# Patient Record
Sex: Male | Born: 1960 | Race: White | Hispanic: No | Marital: Single | Smoking: Current every day smoker
Health system: Southern US, Community
[De-identification: ages and names within clinical notes are randomized; demographics above are authoritative.]

## PROBLEM LIST (undated history)

## (undated) DIAGNOSIS — I10 Essential (primary) hypertension: Secondary | ICD-10-CM

## (undated) DIAGNOSIS — F1010 Alcohol abuse, uncomplicated: Secondary | ICD-10-CM

---

## 2004-03-17 ENCOUNTER — Emergency Department (HOSPITAL_COMMUNITY): Admission: EM | Admit: 2004-03-17 | Discharge: 2004-03-17 | Payer: Self-pay | Admitting: Emergency Medicine

## 2005-01-04 ENCOUNTER — Ambulatory Visit: Payer: Self-pay | Admitting: Family Medicine

## 2005-07-13 ENCOUNTER — Ambulatory Visit: Payer: Self-pay | Admitting: Family Medicine

## 2005-07-28 ENCOUNTER — Ambulatory Visit: Payer: Self-pay | Admitting: Family Medicine

## 2007-03-15 ENCOUNTER — Emergency Department (HOSPITAL_COMMUNITY): Admission: EM | Admit: 2007-03-15 | Discharge: 2007-03-15 | Payer: Self-pay | Admitting: Emergency Medicine

## 2011-06-14 LAB — URINALYSIS, ROUTINE W REFLEX MICROSCOPIC
Hgb urine dipstick: NEGATIVE
Ketones, ur: NEGATIVE
Nitrite: NEGATIVE
Protein, ur: NEGATIVE
Specific Gravity, Urine: 1.017

## 2011-06-14 LAB — DIFFERENTIAL
Basophils Relative: 1
Eosinophils Absolute: 0.1
Lymphocytes Relative: 34
Monocytes Relative: 7
Neutro Abs: 3.3
Neutrophils Relative %: 58

## 2011-06-14 LAB — RAPID URINE DRUG SCREEN, HOSP PERFORMED
Amphetamines: NOT DETECTED
Cocaine: NOT DETECTED
Opiates: NOT DETECTED

## 2011-06-14 LAB — CBC
HCT: 49.9
Hemoglobin: 17.7 — ABNORMAL HIGH
MCHC: 35.4
Platelets: 280
RBC: 5.98 — ABNORMAL HIGH
RDW: 14.3 — ABNORMAL HIGH
WBC: 5.7

## 2011-06-14 LAB — BASIC METABOLIC PANEL
BUN: 7
Creatinine, Ser: 0.65

## 2014-01-08 ENCOUNTER — Encounter (HOSPITAL_COMMUNITY): Payer: Self-pay | Admitting: Emergency Medicine

## 2014-01-08 ENCOUNTER — Emergency Department (HOSPITAL_COMMUNITY)
Admission: EM | Admit: 2014-01-08 | Discharge: 2014-01-09 | Disposition: A | Discharge status: Home / Self Care | Payer: Self-pay | Attending: Emergency Medicine | Admitting: Emergency Medicine

## 2014-01-08 DIAGNOSIS — F17200 Nicotine dependence, unspecified, uncomplicated: Secondary | ICD-10-CM | POA: Insufficient documentation

## 2014-01-08 DIAGNOSIS — F1010 Alcohol abuse, uncomplicated: Secondary | ICD-10-CM | POA: Insufficient documentation

## 2014-01-08 DIAGNOSIS — I10 Essential (primary) hypertension: Secondary | ICD-10-CM | POA: Insufficient documentation

## 2014-01-08 HISTORY — DX: Essential (primary) hypertension: I10

## 2014-01-08 LAB — COMPREHENSIVE METABOLIC PANEL
ALBUMIN: 4.1 g/dL (ref 3.5–5.2)
ALT: 375 U/L — ABNORMAL HIGH (ref 0–53)
AST: 456 U/L — ABNORMAL HIGH (ref 0–37)
Alkaline Phosphatase: 192 U/L — ABNORMAL HIGH (ref 39–117)
BILIRUBIN TOTAL: 2.1 mg/dL — AB (ref 0.3–1.2)
BUN: 8 mg/dL (ref 6–23)
CALCIUM: 9.8 mg/dL (ref 8.4–10.5)
CHLORIDE: 93 meq/L — AB (ref 96–112)
CO2: 26 meq/L (ref 19–32)
CREATININE: 0.57 mg/dL (ref 0.50–1.35)
GFR calc Af Amer: >90 mL/min (ref >90)
GFR calc non Af Amer: >90 mL/min (ref >90)
Glucose, Bld: 99 mg/dL (ref 70–99)
POTASSIUM: 3.5 meq/L — AB (ref 3.7–5.3)
Sodium: 134 mEq/L — ABNORMAL LOW (ref 137–147)
TOTAL PROTEIN: 7.4 g/dL (ref 6.0–8.3)

## 2014-01-08 LAB — CBC
HEMATOCRIT: 45.3 % (ref 39.0–52.0)
HEMOGLOBIN: 16.2 g/dL (ref 13.0–17.0)
MCH: 32.1 pg (ref 26.0–34.0)
MCHC: 35.8 g/dL (ref 30.0–36.0)
MCV: 89.9 fL (ref 78.0–100.0)
Platelets: 112 10*3/uL — ABNORMAL LOW (ref 150–400)
RBC: 5.04 MIL/uL (ref 4.22–5.81)
RDW: 13 % (ref 11.5–15.5)
WBC: 5.1 10*3/uL (ref 4.0–10.5)

## 2014-01-08 LAB — RAPID URINE DRUG SCREEN, HOSP PERFORMED
Amphetamines: NOT DETECTED
BARBITURATES: NOT DETECTED
Benzodiazepines: NOT DETECTED
Cocaine: NOT DETECTED
Opiates: NOT DETECTED
Tetrahydrocannabinol: NOT DETECTED

## 2014-01-08 LAB — ACETAMINOPHEN LEVEL: Acetaminophen (Tylenol), Serum: <15 ug/mL (ref 10–30)

## 2014-01-08 LAB — SALICYLATE LEVEL: Salicylate Lvl: <2 mg/dL — ABNORMAL LOW (ref 2.8–20.0)

## 2014-01-08 LAB — ETHANOL: Alcohol, Ethyl (B): <11 mg/dL (ref 0–11)

## 2014-01-08 MED ORDER — LORAZEPAM 1 MG PO TABS: 0.0000 mg | ORAL_TABLET | Freq: Two times a day (BID) | ORAL | Status: DC

## 2014-01-08 MED ORDER — LORAZEPAM 1 MG PO TABS
0.0000 mg | ORAL_TABLET | Freq: Four times a day (QID) | ORAL | Status: DC
  Administered 2014-01-08: 1 mg via ORAL
  Filled 2014-01-08: qty 1

## 2014-01-08 MED ORDER — NICOTINE 21 MG/24HR TD PT24
21.0000 mg | MEDICATED_PATCH | Freq: Every day | TRANSDERMAL | Status: DC
  Administered 2014-01-08: 21 mg via TRANSDERMAL
  Filled 2014-01-08: qty 1

## 2014-01-08 MED ORDER — VITAMIN B-1 100 MG PO TABS
100.0000 mg | ORAL_TABLET | Freq: Every day | ORAL | Status: DC
  Administered 2014-01-08: 100 mg via ORAL
  Filled 2014-01-08: qty 1

## 2014-01-08 MED ORDER — THIAMINE HCL 100 MG/ML IJ SOLN: 100.0000 mg | Freq: Every day | INTRAMUSCULAR | Status: DC

## 2014-01-08 MED ORDER — LORAZEPAM 1 MG PO TABS
1.0000 mg | ORAL_TABLET | Freq: Once | ORAL | Status: AC
  Administered 2014-01-08: 1 mg via ORAL
  Filled 2014-01-08: qty 2

## 2014-01-08 NOTE — ED Notes (Addendum)
CALLED [facility] AND SPOKE TO THEM ABOUT WHAT THE PT WAS SENT HERE FOR. PT MENTIONED "GET MEDS AND GET MEDICALLY CLEARED". SPOKE TO NANCY. SHE VERBALIZES CONCERN DUE TO PT BP ELEVATED AND TREMORS. SHE IS CONCERNED PT WILL HAVE WORSENING WITHDRAWAL SYMPTOMS AND THEY ARE NOT A DETOX UNIT. SHE ADVISES PT DOES HAVE A BED AND IF WE COULD MONITOR PT OVERNITE. HE CAN COME IN THE MORNING IF HE IS STABLE. PT STATES HE DRANK 1 40 OZ BEER Sunday AND 2 ON Saturday...[phone]

## 2014-01-08 NOTE — ED Provider Notes (Signed)
CSN: [id number]     Arrival date & time 01/08/14  1111 History   This chart was scribed for non-physician practitioner working with [name], MD, by Jarvis Morganaylor Ferguson, ED Scribe. This patient was seen in room TR08C/TR08C and the patient's care was started at 12:30 PM     Chief Complaint  Patient presents with  . Medical Clearance     The history is provided by the patient. No language interpreter was used.   HPI Comments: Thomas StarcherRobert W Hester is a 53 y.o. male who presents to the Emergency Department for an alcohol detox. Patient is seeking treatment for his alcohol abuse and is trying to get cleared to be [facility]. Patient reports that he has been an every day drinker for 36 years. Pt reports that he would drink 160 oz of alcohol daily. Patient states that his last drink was on Sunday. He states that he is ready to quit drinking due to the disruption it is putting on his life.  He denies any h/o of drug use. Patient is current every day smoker. Patient denies any SI or HI.     Past Medical History  Diagnosis Date  . Hypertension    History reviewed. No pertinent past surgical history. History reviewed. No pertinent family history. History  Substance Use Topics  . Smoking status: Current Every Day Smoker    Types: Cigarettes  . Smokeless tobacco: Not on file  . Alcohol Use: Yes    Review of Systems  Psychiatric/Behavioral: Negative for suicidal ideas.       No HI  All other systems reviewed and are negative.     Allergies  Review of patient's allergies indicates no known allergies.  Home Medications   Prior to Admission medications   Not on File   Triage Vitals: BP 150/100  Pulse 88  Temp(Src) 98.7 F (37.1 C) (Oral)  Resp 16  Ht 5\' 10"  (1.778 m)  Wt 167 lb (75.751 kg)  BMI 23.96 kg/m2  SpO2 99%  Physical Exam  Nursing note and vitals reviewed. Constitutional: He is oriented to person, place, and time. He appears well-developed  and well-nourished. No distress.  HENT:  Head: Normocephalic and atraumatic.  Right Ear: External ear normal.  Left Ear: External ear normal.  Nose: Nose normal.  Mouth/Throat: Oropharynx is clear and moist.  Eyes: Conjunctivae are normal.  Neck: Normal range of motion. Neck supple.  Cardiovascular: Normal rate.   Pulmonary/Chest: Effort normal.  Abdominal: Soft.  Musculoskeletal: Normal range of motion.  Neurological: He is alert and oriented to person, place, and time.  Skin: Skin is warm and dry. He is not diaphoretic.  Psychiatric: He has a normal mood and affect.    ED Course  Procedures (including critical care time)  DIAGNOSTIC STUDIES: Oxygen Saturation is 99% on RA, normal by my interpretation.    COORDINATION OF CARE:    Labs Review Labs Reviewed  CBC - Abnormal; Notable for the following:    Platelets 112 (*)    All other components within normal limits  COMPREHENSIVE METABOLIC PANEL - Abnormal; Notable for the following:    Sodium 134 (*)    Potassium 3.5 (*)    Chloride 93 (*)    AST 456 (*)    ALT 375 (*)    Alkaline Phosphatase 192 (*)    Total Bilirubin 2.1 (*)    All other components within normal limits  SALICYLATE LEVEL - Abnormal; Notable for the following:  Salicylate Lvl <2.0 (*)    All other components within normal limits  ACETAMINOPHEN LEVEL  ETHANOL  URINE RAPID DRUG SCREEN (HOSP PERFORMED)    Imaging Review No results found.   EKG Interpretation None      MDM   Final diagnoses:  None    Patient will be observed here in the ED for alcohol withdrawal. [facility].   I personally performed the services described in this documentation, which was scribed in my presence. The recorded information has been reviewed and is accurate.       Emilia BeckKaitlyn Valyn Latchford, PA-C 01/08/14 1609

## 2014-01-08 NOTE — ED Notes (Signed)
Patient denies any symptoms of withdrawal at this time.   Patient denies SI or HI.

## 2014-01-08 NOTE — ED Notes (Signed)
Pt knows that urine is needed pt does not have to void at this time  

## 2014-01-08 NOTE — ED Provider Notes (Signed)
Medical screening examination/treatment/procedure(s) were performed by non-physician practitioner and as supervising physician I was immediately available for consultation/collaboration.    [name], MD 01/08/14 2213

## 2014-01-08 NOTE — Progress Notes (Signed)
  CARE MANAGEMENT ED NOTE 01/08/2014  Patient:  Romelle StarcherMETZ,Tauheed W   Account Number:  [id number]  Date Initiated:  01/08/2014  Documentation initiated by:  Ferdinand CavaSCHETTINO,Antuan Limes  Subjective/Objective Assessment:   53 yo presenting to the ED requesting detox     Subjective/Objective Assessment Detail:     Action/Plan:   Action/Plan Detail:   Anticipated DC Date:       Status Recommendation to Physician:   Result of Recommendation:      DC Planning Services  Other    Choice offered to / List presented to:  C-1 Patient          Status of service:    ED Comments:   ED Comments Detail:  CM spoke with patient regarding ED visit and no documented PCP or insurance coverage. Patient stated that he does receive medical care and [facility] and he follows with his psychiatrist Valinda HoarMeredith Baker. He stated that Valinda HoarMeredith Baker is retiring in 4 months. Encouraged patient to speak with his current psychiatrist for referral and provided a resource for [facility]. Also provided a resource list for local PCPs. Patient stated that he has money and can afford care but will keep the list if needed. He had no further questions or concerns.

## 2014-01-08 NOTE — ED Notes (Signed)
Patient reports he drank a 40 oz beer on Sunday. States had maybe 2 on Saturday. He has been drinking this time for about 1.5 months. States he relapsed after 6 months sober. His daily etoh consumption was around 4 40 ounce beers daily. He denies si/hi. He plans to go to [facility] then to go to a half way house. He is calm and cooperative.

## 2014-01-08 NOTE — ED Notes (Signed)
Pt reports needing to be medically cleared for outpatient treatment for etoh. Last drank Sunday. Denies SI or HI.

## 2014-01-08 NOTE — ED Notes (Signed)
PATIENT HAS AMBULATED TO POD C 

## 2014-01-09 MED ORDER — LISINOPRIL 10 MG PO TABS: 10.0000 mg | ORAL_TABLET | Freq: Every day | ORAL | Status: DC

## 2014-01-09 NOTE — ED Notes (Signed)
Ate breakfast  No c/o discomfort

## 2014-01-09 NOTE — Discharge Instructions (Signed)
Alcohol Problems °Most adults who drink alcohol drink in moderation (not a lot) are at low risk for developing problems related to their drinking. However, all drinkers, including low-risk drinkers, should know about the health risks connected with drinking alcohol. °RECOMMENDATIONS FOR LOW-RISK DRINKING  °Drink in moderation. Moderate drinking is defined as follows:  °· Men - no more than 2 drinks per day. °· Nonpregnant women - no more than 1 drink per day. °· Over age 65 - no more than 1 drink per day. °A standard drink is 12 grams of pure alcohol, which is equal to a 12 ounce bottle of beer or wine cooler, a 5 ounce glass of wine, or 1.5 ounces of distilled spirits (such as whiskey, brandy, vodka, or rum).  °ABSTAIN FROM (DO NOT DRINK) ALCOHOL: °· When pregnant or considering pregnancy. °· When taking a medication that interacts with alcohol. °· If you are alcohol dependent. °· A medical condition that prohibits drinking alcohol (such as ulcer, liver disease, or heart disease). °DISCUSS WITH YOUR CAREGIVER: °· If you are at risk for coronary heart disease, discuss the potential benefits and risks of alcohol use: Light to moderate drinking is associated with lower rates of coronary heart disease in certain populations (for example, men over age 45 and postmenopausal women). Infrequent or nondrinkers are advised not to begin light to moderate drinking to reduce the risk of coronary heart disease so as to avoid creating an alcohol-related problem. Similar protective effects can likely be gained through proper diet and exercise. °· Women and the elderly have smaller amounts of body water than men. As a result women and the elderly achieve a higher blood alcohol concentration after drinking the same amount of alcohol. °· Exposing a fetus to alcohol can cause a broad range of birth defects referred to as Fetal Alcohol Syndrome (FAS) or Alcohol-Related Birth Defects (ARBD). Although FAS/ARBD is connected with excessive  alcohol consumption during pregnancy, studies also have reported neurobehavioral problems in infants born to mothers reporting drinking an average of 1 drink per day during pregnancy. °· Heavier drinking (the consumption of more than 4 drinks per occasion by men and more than 3 drinks per occasion by women) impairs learning (cognitive) and psychomotor functions and increases the risk of alcohol-related problems, including accidents and injuries. °CAGE QUESTIONS:  °· Have you ever felt that you should Cut down on your drinking? °· Have people Annoyed you by criticizing your drinking? °· Have you ever felt bad or Guilty about your drinking? °· Have you ever had a drink first thing in the morning to steady your nerves or get rid of a hangover (Eye opener)? °If you answered positively to any of these questions: You may be at risk for alcohol-related problems if alcohol consumption is:  °· Men: Greater than 14 drinks per week or more than 4 drinks per occasion. °· Women: Greater than 7 drinks per week or more than 3 drinks per occasion. °Do you or your family have a medical history of alcohol-related problems, such as: °· Blackouts. °· Sexual dysfunction. °· Depression. °· Trauma. °· Liver dysfunction. °· Sleep disorders. °· Hypertension. °· Chronic abdominal pain. °· Has your drinking ever caused you problems, such as problems with your family, problems with your work (or school) performance, or accidents/injuries? °· Do you have a compulsion to drink or a preoccupation with drinking? °· Do you have poor control or are you unable to stop drinking once you have started? °· Do you have to drink to   avoid withdrawal symptoms? °· Do you have problems with withdrawal such as tremors, nausea, sweats, or mood disturbances? °· Does it take more alcohol than in the past to get you high? °· Do you feel a strong urge to drink? °· Do you change your plans so that you can have a drink? °· Do you ever drink in the morning to relieve  the shakes or a hangover? °If you have answered a number of the previous questions positively, it may be time for you to talk to your caregivers, family, and friends and see if they think you have a problem. Alcoholism is a chemical dependency that keeps getting worse and will eventually destroy your health and relationships. Many alcoholics end up dead, impoverished, or in prison. This is often the end result of all chemical dependency. °· Do not be discouraged if you are not ready to take action immediately. °· Decisions to change behavior often involve up and down desires to change and feeling like you cannot decide. °· Try to think more seriously about your drinking behavior. °· Think of the reasons to quit. °WHERE TO GO FOR ADDITIONAL INFORMATION  °· The National Institute on Alcohol Abuse and Alcoholism (NIAAA) °www.niaaa.nih.gov °· National Council on Alcoholism and Drug Dependence (NCADD) °www.ncadd.org °· American Society of Addiction Medicine (ASAM) °www.asam.org  °Document Released: 08/16/2005 Document Revised: 11/08/2011 Document Reviewed: 04/03/2008 °Exit[employer]® Patient Information ©2014 [employer]. ° °

## 2014-01-09 NOTE — ED Notes (Signed)
Pt given his clothes and prepped for d/c [facility] on the way to get pt

## 2017-02-23 ENCOUNTER — Emergency Department (HOSPITAL_COMMUNITY): Payer: Self-pay

## 2017-02-23 ENCOUNTER — Inpatient Hospital Stay (HOSPITAL_COMMUNITY)
Admission: EM | Admit: 2017-02-23 | Discharge: 2017-03-10 | DRG: 086 | Disposition: A | Discharge status: Skilled Nursing Facility | Payer: Self-pay | Attending: General Surgery | Admitting: General Surgery

## 2017-02-23 DIAGNOSIS — Z720 Tobacco use: Secondary | ICD-10-CM | POA: Diagnosis present

## 2017-02-23 DIAGNOSIS — I429 Cardiomyopathy, unspecified: Secondary | ICD-10-CM | POA: Diagnosis present

## 2017-02-23 DIAGNOSIS — R402242 Coma scale, best verbal response, confused conversation, at arrival to emergency department: Secondary | ICD-10-CM | POA: Diagnosis present

## 2017-02-23 DIAGNOSIS — W19XXXA Unspecified fall, initial encounter: Secondary | ICD-10-CM | POA: Diagnosis present

## 2017-02-23 DIAGNOSIS — S069X9A Unspecified intracranial injury with loss of consciousness of unspecified duration, initial encounter: Secondary | ICD-10-CM | POA: Diagnosis present

## 2017-02-23 DIAGNOSIS — F17210 Nicotine dependence, cigarettes, uncomplicated: Secondary | ICD-10-CM | POA: Diagnosis present

## 2017-02-23 DIAGNOSIS — Z590 Homelessness: Secondary | ICD-10-CM

## 2017-02-23 DIAGNOSIS — I629 Nontraumatic intracranial hemorrhage, unspecified: Secondary | ICD-10-CM

## 2017-02-23 DIAGNOSIS — E876 Hypokalemia: Secondary | ICD-10-CM | POA: Diagnosis present

## 2017-02-23 DIAGNOSIS — F1010 Alcohol abuse, uncomplicated: Secondary | ICD-10-CM | POA: Diagnosis present

## 2017-02-23 DIAGNOSIS — S0101XA Laceration without foreign body of scalp, initial encounter: Secondary | ICD-10-CM | POA: Diagnosis present

## 2017-02-23 DIAGNOSIS — E871 Hypo-osmolality and hyponatremia: Secondary | ICD-10-CM | POA: Diagnosis present

## 2017-02-23 DIAGNOSIS — R402142 Coma scale, eyes open, spontaneous, at arrival to emergency department: Secondary | ICD-10-CM | POA: Diagnosis present

## 2017-02-23 DIAGNOSIS — R000 Tachycardia, unspecified: Secondary | ICD-10-CM | POA: Diagnosis not present

## 2017-02-23 DIAGNOSIS — D62 Acute posthemorrhagic anemia: Secondary | ICD-10-CM | POA: Diagnosis present

## 2017-02-23 DIAGNOSIS — E860 Dehydration: Secondary | ICD-10-CM | POA: Diagnosis present

## 2017-02-23 DIAGNOSIS — S022XXA Fracture of nasal bones, initial encounter for closed fracture: Secondary | ICD-10-CM | POA: Diagnosis present

## 2017-02-23 DIAGNOSIS — Z23 Encounter for immunization: Secondary | ICD-10-CM

## 2017-02-23 DIAGNOSIS — S065X0A Traumatic subdural hemorrhage without loss of consciousness, initial encounter: Principal | ICD-10-CM | POA: Diagnosis present

## 2017-02-23 DIAGNOSIS — S0181XA Laceration without foreign body of other part of head, initial encounter: Secondary | ICD-10-CM | POA: Diagnosis present

## 2017-02-23 DIAGNOSIS — S069XAA Unspecified intracranial injury with loss of consciousness status unknown, initial encounter: Secondary | ICD-10-CM | POA: Diagnosis present

## 2017-02-23 DIAGNOSIS — D649 Anemia, unspecified: Secondary | ICD-10-CM | POA: Diagnosis present

## 2017-02-23 DIAGNOSIS — F10220 Alcohol dependence with intoxication, uncomplicated: Secondary | ICD-10-CM | POA: Diagnosis present

## 2017-02-23 DIAGNOSIS — R402362 Coma scale, best motor response, obeys commands, at arrival to emergency department: Secondary | ICD-10-CM | POA: Diagnosis present

## 2017-02-23 DIAGNOSIS — R739 Hyperglycemia, unspecified: Secondary | ICD-10-CM | POA: Diagnosis present

## 2017-02-23 DIAGNOSIS — F10239 Alcohol dependence with withdrawal, unspecified: Secondary | ICD-10-CM | POA: Diagnosis present

## 2017-02-23 DIAGNOSIS — S065XAA Traumatic subdural hemorrhage with loss of consciousness status unknown, initial encounter: Secondary | ICD-10-CM | POA: Diagnosis present

## 2017-02-23 DIAGNOSIS — S065X9A Traumatic subdural hemorrhage with loss of consciousness of unspecified duration, initial encounter: Secondary | ICD-10-CM | POA: Diagnosis present

## 2017-02-23 DIAGNOSIS — I10 Essential (primary) hypertension: Secondary | ICD-10-CM | POA: Diagnosis present

## 2017-02-23 DIAGNOSIS — E222 Syndrome of inappropriate secretion of antidiuretic hormone: Secondary | ICD-10-CM | POA: Diagnosis present

## 2017-02-23 DIAGNOSIS — S069X0A Unspecified intracranial injury without loss of consciousness, initial encounter: Secondary | ICD-10-CM

## 2017-02-23 HISTORY — DX: Alcohol abuse, uncomplicated: F10.10

## 2017-02-23 LAB — URINALYSIS, ROUTINE W REFLEX MICROSCOPIC
Bilirubin Urine: NEGATIVE
Glucose, UA: NEGATIVE mg/dL
Hgb urine dipstick: NEGATIVE
KETONES UR: 20 mg/dL — AB
Leukocytes, UA: NEGATIVE
Nitrite: NEGATIVE
PH: 6 (ref 5.0–8.0)
PROTEIN: NEGATIVE mg/dL
SPECIFIC GRAVITY, URINE: 1.015 (ref 1.005–1.030)

## 2017-02-23 LAB — CBC WITH DIFFERENTIAL/PLATELET
BASOS ABS: 0 10*3/uL (ref 0.0–0.1)
Basophils Relative: 0 %
Eosinophils Absolute: 0 10*3/uL (ref 0.0–0.7)
Eosinophils Relative: 0 %
HCT: 30.5 % — ABNORMAL LOW (ref 39.0–52.0)
HEMOGLOBIN: 11.1 g/dL — AB (ref 13.0–17.0)
Lymphocytes Relative: 16 %
Lymphs Abs: 1 10*3/uL (ref 0.7–4.0)
MCH: 31.5 pg (ref 26.0–34.0)
MCHC: 36.4 g/dL — ABNORMAL HIGH (ref 30.0–36.0)
MCV: 86.6 fL (ref 78.0–100.0)
MONO ABS: 0.6 10*3/uL (ref 0.1–1.0)
Monocytes Relative: 10 %
NEUTROS ABS: 4.5 10*3/uL (ref 1.7–7.7)
Neutrophils Relative %: 74 %
Platelets: 205 10*3/uL (ref 150–400)
RBC: 3.52 MIL/uL — ABNORMAL LOW (ref 4.22–5.81)
RDW: 13.6 % (ref 11.5–15.5)
WBC: 6.1 10*3/uL (ref 4.0–10.5)

## 2017-02-23 LAB — CBG MONITORING, ED: Glucose-Capillary: 118 mg/dL — ABNORMAL HIGH (ref 65–99)

## 2017-02-23 LAB — COMPREHENSIVE METABOLIC PANEL
ALT: 74 U/L — AB (ref 17–63)
AST: 45 U/L — ABNORMAL HIGH (ref 15–41)
Albumin: 3.7 g/dL (ref 3.5–5.0)
Alkaline Phosphatase: 60 U/L (ref 38–126)
Anion gap: 14 (ref 5–15)
BILIRUBIN TOTAL: 2.2 mg/dL — AB (ref 0.3–1.2)
BUN: 15 mg/dL (ref 6–20)
CALCIUM: 9.6 mg/dL (ref 8.9–10.3)
CO2: 22 mmol/L (ref 22–32)
Chloride: 87 mmol/L — ABNORMAL LOW (ref 101–111)
Creatinine, Ser: 0.92 mg/dL (ref 0.61–1.24)
Glucose, Bld: 125 mg/dL — ABNORMAL HIGH (ref 65–99)
Potassium: 3.6 mmol/L (ref 3.5–5.1)
Sodium: 123 mmol/L — ABNORMAL LOW (ref 135–145)
TOTAL PROTEIN: 6.3 g/dL — AB (ref 6.5–8.1)

## 2017-02-23 LAB — SAMPLE TO BLOOD BANK

## 2017-02-23 LAB — MRSA PCR SCREENING: MRSA by PCR: NEGATIVE

## 2017-02-23 LAB — ETHANOL

## 2017-02-23 LAB — PROTIME-INR
INR: 1.03
PROTHROMBIN TIME: 13.6 s (ref 11.4–15.2)

## 2017-02-23 LAB — RAPID URINE DRUG SCREEN, HOSP PERFORMED
Amphetamines: NOT DETECTED
Barbiturates: NOT DETECTED
Benzodiazepines: NOT DETECTED
Cocaine: NOT DETECTED
Opiates: NOT DETECTED
TETRAHYDROCANNABINOL: NOT DETECTED

## 2017-02-23 LAB — APTT: aPTT: 20 seconds — ABNORMAL LOW (ref 24–36)

## 2017-02-23 MED ORDER — SODIUM CHLORIDE 0.9 % IV BOLUS (SEPSIS)
1000.0000 mL | Freq: Once | INTRAVENOUS | Status: AC
  Administered 2017-02-23: 1000 mL via INTRAVENOUS

## 2017-02-23 MED ORDER — SODIUM CHLORIDE 0.9 % IV SOLN
500.0000 mg | Freq: Two times a day (BID) | INTRAVENOUS | Status: DC
  Administered 2017-02-23 – 2017-02-27 (×8): 500 mg via INTRAVENOUS
  Filled 2017-02-23 (×8): qty 5

## 2017-02-23 MED ORDER — CHLORHEXIDINE GLUCONATE 0.12 % MT SOLN
15.0000 mL | Freq: Two times a day (BID) | OROMUCOSAL | Status: DC
  Administered 2017-02-24 – 2017-03-10 (×28): 15 mL via OROMUCOSAL
  Filled 2017-02-23 (×26): qty 15

## 2017-02-23 MED ORDER — SODIUM CHLORIDE 0.9 % IV SOLN
INTRAVENOUS | Status: DC
  Administered 2017-02-23 – 2017-02-25 (×2): via INTRAVENOUS

## 2017-02-23 MED ORDER — TETANUS-DIPHTH-ACELL PERTUSSIS 5-2.5-18.5 LF-MCG/0.5 IM SUSP
0.5000 mL | Freq: Once | INTRAMUSCULAR | Status: AC
  Administered 2017-02-23: 0.5 mL via INTRAMUSCULAR

## 2017-02-23 MED ORDER — ORAL CARE MOUTH RINSE
15.0000 mL | Freq: Two times a day (BID) | OROMUCOSAL | Status: DC
  Administered 2017-02-24 – 2017-02-27 (×5): 15 mL via OROMUCOSAL

## 2017-02-23 MED ORDER — LORAZEPAM 2 MG/ML IJ SOLN
1.0000 mg | INTRAMUSCULAR | Status: DC | PRN
  Administered 2017-02-23 – 2017-02-27 (×4): 2 mg via INTRAVENOUS
  Administered 2017-02-28: 1 mg via INTRAVENOUS
  Administered 2017-03-01 – 2017-03-02 (×3): 2 mg via INTRAVENOUS
  Administered 2017-03-05: 1 mg via INTRAVENOUS
  Administered 2017-03-07 (×2): 2 mg via INTRAVENOUS
  Filled 2017-02-23 (×12): qty 1

## 2017-02-23 MED ORDER — SODIUM CHLORIDE 0.9 % IV SOLN
0.2000 ug/kg/h | INTRAVENOUS | Status: AC
  Administered 2017-02-23: 0.2 ug/kg/h via INTRAVENOUS
  Administered 2017-02-23: 0.5 ug/kg/h via INTRAVENOUS
  Administered 2017-02-24: 0.3 ug/kg/h via INTRAVENOUS
  Filled 2017-02-23 (×3): qty 2

## 2017-02-23 MED ORDER — STROKE: EARLY STAGES OF RECOVERY BOOK
Freq: Once | Status: AC
  Administered 2017-02-23: 18:00:00
  Filled 2017-02-23: qty 1

## 2017-02-23 MED ORDER — ACETAMINOPHEN 160 MG/5ML PO SOLN: 650.0000 mg | ORAL | Status: DC | PRN

## 2017-02-23 MED ORDER — TETANUS-DIPHTH-ACELL PERTUSSIS 5-2.5-18.5 LF-MCG/0.5 IM SUSP
INTRAMUSCULAR | Status: AC
  Filled 2017-02-23: qty 0.5

## 2017-02-23 MED ORDER — NICARDIPINE HCL IN NACL 20-0.86 MG/200ML-% IV SOLN
3.0000 mg/h | INTRAVENOUS | Status: DC
  Administered 2017-02-24: 5 mg/h via INTRAVENOUS
  Administered 2017-02-24: 7 mg/h via INTRAVENOUS
  Administered 2017-02-24: 10 mg/h via INTRAVENOUS
  Filled 2017-02-23 (×3): qty 200

## 2017-02-23 MED ORDER — PANTOPRAZOLE SODIUM 40 MG IV SOLR
40.0000 mg | Freq: Every day | INTRAVENOUS | Status: DC
  Administered 2017-02-23 – 2017-02-24 (×2): 40 mg via INTRAVENOUS
  Filled 2017-02-23 (×2): qty 40

## 2017-02-23 MED ORDER — ACETAMINOPHEN 650 MG RE SUPP: 650.0000 mg | RECTAL | Status: DC | PRN

## 2017-02-23 MED ORDER — LIDOCAINE-EPINEPHRINE (PF) 2 %-1:200000 IJ SOLN
10.0000 mL | Freq: Once | INTRAMUSCULAR | Status: AC
  Administered 2017-02-23: 10 mL
  Filled 2017-02-23: qty 20

## 2017-02-23 MED ORDER — ACETAMINOPHEN 325 MG PO TABS
650.0000 mg | ORAL_TABLET | ORAL | Status: DC | PRN
  Administered 2017-02-28 – 2017-03-06 (×9): 650 mg via ORAL
  Filled 2017-02-23 (×9): qty 2

## 2017-02-23 MED ORDER — CEFAZOLIN SODIUM-DEXTROSE 2-4 GM/100ML-% IV SOLN
2.0000 g | Freq: Once | INTRAVENOUS | Status: AC
  Administered 2017-02-23: 2 g via INTRAVENOUS
  Filled 2017-02-23: qty 100

## 2017-02-23 MED ORDER — SENNOSIDES-DOCUSATE SODIUM 8.6-50 MG PO TABS
1.0000 | ORAL_TABLET | Freq: Two times a day (BID) | ORAL | Status: DC
  Administered 2017-02-24 – 2017-03-09 (×26): 1 via ORAL
  Filled 2017-02-23 (×26): qty 1
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
## 2017-02-23 NOTE — ED Notes (Signed)
Pt found in room out of bed with leads removed, pt states, "I need to go to the bathroom. I got to pee." pt returned to the bed & reoriented, pt encouraged to call for assistance, pt encouraged to urinate in his condom cath, pt had removed c collar, this RN placed c collar and encouraged the pt to remain in his c collar and to remain in bed, pt reoriented, curtain open, pt in room close to nurses station & visible at  Nurses station, pt calm at this time

## 2017-02-23 NOTE — ED Notes (Signed)
Pt's CBG 118.  Informed Toniann FailWendy, RN.

## 2017-02-23 NOTE — ED Notes (Signed)
Rhunette CroftNanavati, MD aware of no bed request placed for this pt, Rhunette CroftNanavati, MD to contact admitting service
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
## 2017-02-23 NOTE — ED Notes (Signed)
Attempted to call phone number  [phone] with no answer

## 2017-02-23 NOTE — ED Notes (Signed)
Rhunette CroftNanavati, MD at bedside, per Rhunette CroftNanavati, MD pt not to receive Cardene until BP >160 systolic, pt having suture placed at this time, will reassess

## 2017-02-23 NOTE — ED Notes (Signed)
Wyatt, MD at bedside. 

## 2017-02-23 NOTE — ED Notes (Signed)
Pt removed c collar; per Dr Lindie SpruceWyatt ok to leave off

## 2017-02-23 NOTE — ED Notes (Signed)
Condom cath placed on the pt  
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
## 2017-02-24 ENCOUNTER — Inpatient Hospital Stay (HOSPITAL_COMMUNITY): Payer: Self-pay

## 2017-02-24 LAB — BASIC METABOLIC PANEL
Anion gap: 9 (ref 5–15)
BUN: 9 mg/dL (ref 6–20)
CALCIUM: 9 mg/dL (ref 8.9–10.3)
CO2: 24 mmol/L (ref 22–32)
CREATININE: 0.66 mg/dL (ref 0.61–1.24)
Chloride: 98 mmol/L — ABNORMAL LOW (ref 101–111)
Glucose, Bld: 90 mg/dL (ref 65–99)
Potassium: 2.8 mmol/L — ABNORMAL LOW (ref 3.5–5.1)
SODIUM: 131 mmol/L — AB (ref 135–145)

## 2017-02-24 LAB — HEPATIC FUNCTION PANEL
ALBUMIN: 2.9 g/dL — AB (ref 3.5–5.0)
ALK PHOS: 47 U/L (ref 38–126)
ALT: 46 U/L (ref 17–63)
AST: 26 U/L (ref 15–41)
BILIRUBIN INDIRECT: 1 mg/dL — AB (ref 0.3–0.9)
Bilirubin, Direct: 0.4 mg/dL (ref 0.1–0.5)
TOTAL PROTEIN: 5.2 g/dL — AB (ref 6.5–8.1)
Total Bilirubin: 1.4 mg/dL — ABNORMAL HIGH (ref 0.3–1.2)

## 2017-02-24 LAB — HIV ANTIBODY (ROUTINE TESTING W REFLEX): HIV SCREEN 4TH GENERATION: NONREACTIVE

## 2017-02-24 LAB — AMMONIA: Ammonia: 41 umol/L — ABNORMAL HIGH (ref 9–35)

## 2017-02-24 MED ORDER — ADULT MULTIVITAMIN W/MINERALS CH
1.0000 | ORAL_TABLET | Freq: Every day | ORAL | Status: DC
  Administered 2017-02-25 – 2017-03-10 (×14): 1 via ORAL
  Filled 2017-02-24 (×14): qty 1

## 2017-02-24 MED ORDER — METOPROLOL TARTRATE 5 MG/5ML IV SOLN
5.0000 mg | INTRAVENOUS | Status: DC | PRN
  Administered 2017-02-24: 5 mg via INTRAVENOUS
  Filled 2017-02-24: qty 5

## 2017-02-24 MED ORDER — LORAZEPAM 2 MG/ML IJ SOLN
0.0000 mg | Freq: Four times a day (QID) | INTRAMUSCULAR | Status: AC
Start: 2017-02-24 — End: 2017-02-26
  Administered 2017-02-24: 2 mg via INTRAVENOUS
  Administered 2017-02-24: 1 mg via INTRAVENOUS
  Administered 2017-02-25: 4 mg via INTRAVENOUS
  Administered 2017-02-25: 2 mg via INTRAVENOUS
  Administered 2017-02-26: 4 mg via INTRAVENOUS
  Filled 2017-02-24: qty 2
  Filled 2017-02-24 (×3): qty 1
  Filled 2017-02-24: qty 2
  Filled 2017-02-24: qty 1

## 2017-02-24 MED ORDER — LORAZEPAM 1 MG PO TABS
1.0000 mg | ORAL_TABLET | Freq: Four times a day (QID) | ORAL | Status: DC | PRN
  Administered 2017-02-25: 1 mg via ORAL
  Filled 2017-02-24: qty 1

## 2017-02-24 MED ORDER — POTASSIUM CHLORIDE 10 MEQ/100ML IV SOLN
10.0000 meq | INTRAVENOUS | Status: AC
  Administered 2017-02-24 (×5): 10 meq via INTRAVENOUS
  Filled 2017-02-24 (×5): qty 100

## 2017-02-24 MED ORDER — FOLIC ACID 1 MG PO TABS
1.0000 mg | ORAL_TABLET | Freq: Every day | ORAL | Status: DC
  Administered 2017-02-25 – 2017-03-10 (×14): 1 mg via ORAL
  Filled 2017-02-24 (×14): qty 1

## 2017-02-24 MED ORDER — LORAZEPAM 2 MG/ML IJ SOLN: 1.0000 mg | Freq: Four times a day (QID) | INTRAMUSCULAR | Status: DC | PRN

## 2017-02-24 MED ORDER — LORAZEPAM 2 MG/ML IJ SOLN
0.0000 mg | Freq: Two times a day (BID) | INTRAMUSCULAR | Status: AC
  Administered 2017-02-27: 2 mg via INTRAVENOUS
  Filled 2017-02-24: qty 1

## 2017-02-24 MED ORDER — ENSURE ENLIVE PO LIQD
237.0000 mL | Freq: Three times a day (TID) | ORAL | Status: DC
  Administered 2017-02-24 – 2017-03-10 (×39): 237 mL via ORAL
  Filled 2017-02-24 (×41): qty 237

## 2017-02-24 MED ORDER — HYDRALAZINE HCL 20 MG/ML IJ SOLN
10.0000 mg | INTRAMUSCULAR | Status: DC | PRN
  Administered 2017-02-24 – 2017-03-01 (×5): 10 mg via INTRAVENOUS
  Filled 2017-02-24 (×5): qty 1

## 2017-02-24 MED ORDER — VITAMIN B-1 100 MG PO TABS
100.0000 mg | ORAL_TABLET | Freq: Every day | ORAL | Status: DC
  Administered 2017-02-25 – 2017-03-10 (×14): 100 mg via ORAL
  Filled 2017-02-24 (×14): qty 1

## 2017-02-24 MED ORDER — THIAMINE HCL 100 MG/ML IJ SOLN
100.0000 mg | Freq: Every day | INTRAMUSCULAR | Status: DC
  Administered 2017-02-24: 100 mg via INTRAVENOUS
  Filled 2017-02-24: qty 2
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
## 2017-02-24 NOTE — Progress Notes (Signed)
PT Cancellation Note  Patient Details Name: Thomas StarcherRobert W Horsey MRN: [id number] DOB: 07/02/1961   Cancelled Treatment:    Reason Eval/Treat Not Completed: Patient not medically ready. Pt with active bedrest orders. Please update activity orders when appropriate for therapy. Thanks   Marylynn PearsonLaura D Madex Seals 02/24/2017, 7:20 AM   Conni SlipperLaura Montarius Kitagawa, PT, DPT Acute Rehabilitation Services Pager: [phone]
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
## 2017-02-24 NOTE — Progress Notes (Signed)
OT Cancellation Note  Patient Details Name: Thomas StarcherRobert W Hester MRN: [id number] DOB: 04/09/1961   Cancelled Treatment:    Reason Eval/Treat Not Completed: Patient not medically ready. Pt with active bedrest orders. Please update activity orders when appropriate for therapy. Thanks  [facility] Center[facility],HILLARY  Cordell Guercio, OT/L  [phone] 02/24/2017 02/24/2017, 7:18 AM

## 2017-02-24 NOTE — Progress Notes (Signed)
Rehab Admissions Coordinator Note:  Patient was screened by Clois DupesBoyette, Daily Doe Godwin for appropriateness for an Inpatient Acute Rehab Consult.  At this time, we are recommending Inpatient Rehab consult.  Clois DupesBoyette, Ledon Weihe Godwin 02/24/2017, 4:26 PM  I can be reached at [phone].
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
## 2017-02-25 DIAGNOSIS — R739 Hyperglycemia, unspecified: Secondary | ICD-10-CM | POA: Diagnosis present

## 2017-02-25 DIAGNOSIS — S065X9A Traumatic subdural hemorrhage with loss of consciousness of unspecified duration, initial encounter: Secondary | ICD-10-CM | POA: Diagnosis present

## 2017-02-25 DIAGNOSIS — F1010 Alcohol abuse, uncomplicated: Secondary | ICD-10-CM

## 2017-02-25 DIAGNOSIS — I629 Nontraumatic intracranial hemorrhage, unspecified: Secondary | ICD-10-CM

## 2017-02-25 DIAGNOSIS — S069X9A Unspecified intracranial injury with loss of consciousness of unspecified duration, initial encounter: Secondary | ICD-10-CM | POA: Diagnosis present

## 2017-02-25 DIAGNOSIS — E871 Hypo-osmolality and hyponatremia: Secondary | ICD-10-CM

## 2017-02-25 DIAGNOSIS — S065XAA Traumatic subdural hemorrhage with loss of consciousness status unknown, initial encounter: Secondary | ICD-10-CM | POA: Diagnosis present

## 2017-02-25 DIAGNOSIS — E876 Hypokalemia: Secondary | ICD-10-CM

## 2017-02-25 DIAGNOSIS — R0682 Tachypnea, not elsewhere classified: Secondary | ICD-10-CM

## 2017-02-25 DIAGNOSIS — D62 Acute posthemorrhagic anemia: Secondary | ICD-10-CM

## 2017-02-25 DIAGNOSIS — R000 Tachycardia, unspecified: Secondary | ICD-10-CM

## 2017-02-25 DIAGNOSIS — I6200 Nontraumatic subdural hemorrhage, unspecified: Secondary | ICD-10-CM

## 2017-02-25 DIAGNOSIS — D649 Anemia, unspecified: Secondary | ICD-10-CM | POA: Diagnosis present

## 2017-02-25 DIAGNOSIS — I10 Essential (primary) hypertension: Secondary | ICD-10-CM | POA: Diagnosis present

## 2017-02-25 DIAGNOSIS — S069XAA Unspecified intracranial injury with loss of consciousness status unknown, initial encounter: Secondary | ICD-10-CM | POA: Diagnosis present

## 2017-02-25 DIAGNOSIS — S069X0A Unspecified intracranial injury without loss of consciousness, initial encounter: Secondary | ICD-10-CM

## 2017-02-25 LAB — BASIC METABOLIC PANEL
Anion gap: 10 (ref 5–15)
CHLORIDE: 88 mmol/L — AB (ref 101–111)
CO2: 28 mmol/L (ref 22–32)
Calcium: 9 mg/dL (ref 8.9–10.3)
Creatinine, Ser: 0.48 mg/dL — ABNORMAL LOW (ref 0.61–1.24)
GFR calc Af Amer: >60 mL/min (ref >60)
GFR calc non Af Amer: >60 mL/min (ref >60)
GLUCOSE: 131 mg/dL — AB (ref 65–99)
POTASSIUM: 2.2 mmol/L — AB (ref 3.5–5.1)
Sodium: 126 mmol/L — ABNORMAL LOW (ref 135–145)

## 2017-02-25 LAB — CBC
HCT: 30.4 % — ABNORMAL LOW (ref 39.0–52.0)
HEMOGLOBIN: 10.9 g/dL — AB (ref 13.0–17.0)
MCH: 30.4 pg (ref 26.0–34.0)
MCHC: 35.9 g/dL (ref 30.0–36.0)
MCV: 84.9 fL (ref 78.0–100.0)
Platelets: 222 10*3/uL (ref 150–400)
RBC: 3.58 MIL/uL — AB (ref 4.22–5.81)
RDW: 13.3 % (ref 11.5–15.5)
WBC: 5.1 10*3/uL (ref 4.0–10.5)

## 2017-02-25 MED ORDER — POTASSIUM CHLORIDE IN NACL 20-0.9 MEQ/L-% IV SOLN
INTRAVENOUS | Status: DC
  Administered 2017-02-25 – 2017-02-26 (×2): via INTRAVENOUS
  Filled 2017-02-25 (×2): qty 1000

## 2017-02-25 MED ORDER — POTASSIUM CHLORIDE CRYS ER 20 MEQ PO TBCR
40.0000 meq | EXTENDED_RELEASE_TABLET | Freq: Two times a day (BID) | ORAL | Status: AC
  Administered 2017-02-25 (×2): 40 meq via ORAL
  Filled 2017-02-25 (×2): qty 2

## 2017-02-25 MED ORDER — POTASSIUM CHLORIDE 10 MEQ/100ML IV SOLN
10.0000 meq | INTRAVENOUS | Status: AC
  Administered 2017-02-25 (×4): 10 meq via INTRAVENOUS
  Filled 2017-02-25 (×4): qty 100

## 2017-02-25 MED ORDER — METOPROLOL TARTRATE 12.5 MG HALF TABLET
12.5000 mg | ORAL_TABLET | Freq: Two times a day (BID) | ORAL | Status: DC
  Administered 2017-02-25 – 2017-02-28 (×7): 12.5 mg via ORAL
  Filled 2017-02-25 (×7): qty 1

## 2017-02-25 MED ORDER — PANTOPRAZOLE SODIUM 40 MG PO TBEC
40.0000 mg | DELAYED_RELEASE_TABLET | Freq: Every day | ORAL | Status: DC
  Administered 2017-02-25 – 2017-03-09 (×13): 40 mg via ORAL
  Filled 2017-02-25 (×13): qty 1
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
## 2017-02-25 NOTE — Progress Notes (Signed)
Patient ID: Thomas StarcherRobert W Hester, male   DOB: 10/21/1960, 56 y.o.   MRN: [id number] BP 126/89   Pulse (!) 129   Temp 98.2 F (36.8 C) (Axillary)   Resp 19   Ht 5\' 11"  (1.803 m)   Wt 67.6 kg (149 lb 0.5 oz)   SpO2 98%   BMI 20.79 kg/m  Alert moving all extremities CT unchanged. No operative indications Expect steady improvement
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
## 2017-02-25 NOTE — Progress Notes (Signed)
CRITICAL VALUE ALERT  Critical Value: Potssium 2.2  Date & Time Notied:  02/25/17 0501  Provider Notified: Janee Mornhompson, Trauma MD  Orders Received/Actions taken: replace potassium, see MAR

## 2017-02-25 NOTE — Progress Notes (Signed)
I will follow up on Monday to assist with planning dispo. [phone]
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
## 2017-02-25 NOTE — Progress Notes (Signed)
Patient ID: Romelle StarcherRobert W Logan, male   DOB: 12/30/1960, 56 y.o.   MRN: [id number] BP (!) 149/92   Pulse 99   Temp 97.4 F (36.3 C) (Axillary)   Resp (!) 22   Ht 5\' 11"  (1.803 m)   Wt 67.6 kg (149 lb 0.5 oz)   SpO2 99%   BMI 20.79 kg/m  Lethargic, confused  Trying to get out of bed Moving all extremities No changes. No new recommendations expect slow improvement

## 2017-02-26 ENCOUNTER — Encounter (HOSPITAL_COMMUNITY): Payer: Self-pay

## 2017-02-26 LAB — BASIC METABOLIC PANEL
ANION GAP: 6 (ref 5–15)
BUN: 5 mg/dL — ABNORMAL LOW (ref 6–20)
CHLORIDE: 91 mmol/L — AB (ref 101–111)
CO2: 29 mmol/L (ref 22–32)
Calcium: 9.3 mg/dL (ref 8.9–10.3)
Creatinine, Ser: 0.44 mg/dL — ABNORMAL LOW (ref 0.61–1.24)
GFR calc Af Amer: >60 mL/min (ref >60)
GFR calc non Af Amer: >60 mL/min (ref >60)
Glucose, Bld: 107 mg/dL — ABNORMAL HIGH (ref 65–99)
POTASSIUM: 2.9 mmol/L — AB (ref 3.5–5.1)
SODIUM: 126 mmol/L — AB (ref 135–145)

## 2017-02-26 LAB — CBC
HEMATOCRIT: 29.7 % — AB (ref 39.0–52.0)
HEMOGLOBIN: 10.6 g/dL — AB (ref 13.0–17.0)
MCH: 30.5 pg (ref 26.0–34.0)
MCHC: 35.7 g/dL (ref 30.0–36.0)
MCV: 85.3 fL (ref 78.0–100.0)
PLATELETS: 255 10*3/uL (ref 150–400)
RBC: 3.48 MIL/uL — AB (ref 4.22–5.81)
RDW: 13.6 % (ref 11.5–15.5)
WBC: 4.1 10*3/uL (ref 4.0–10.5)

## 2017-02-26 MED ORDER — SODIUM CHLORIDE 1 G PO TABS
1.0000 g | ORAL_TABLET | Freq: Two times a day (BID) | ORAL | Status: DC
  Administered 2017-02-26: 1 g via ORAL
  Filled 2017-02-26 (×2): qty 1

## 2017-02-26 MED ORDER — POTASSIUM CHLORIDE CRYS ER 20 MEQ PO TBCR
40.0000 meq | EXTENDED_RELEASE_TABLET | Freq: Two times a day (BID) | ORAL | Status: AC
  Administered 2017-02-26 (×2): 40 meq via ORAL
  Filled 2017-02-26 (×2): qty 2
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
## 2017-02-26 NOTE — Progress Notes (Signed)
Vitals:   02/26/17 0100 02/26/17 0200 02/26/17 0300 02/26/17 0400  BP: (!) 135/104 (!) 158/113 (!) 169/114 (!) 152/92  Pulse: 87 94 92 73  Resp: 18 (!) 28 (!) 24 (!) 29  Temp:    97.3 F (36.3 C)  TempSrc:    Axillary  SpO2: 99% 99% 100% 99%  Weight:      Height:        CBC  Recent Labs  02/25/17 0332 02/26/17 0306  WBC 5.1 4.1  HGB 10.9* 10.6*  HCT 30.4* 29.7*  PLT 222 255   BMET  Recent Labs  02/25/17 0332 02/26/17 0306  NA 126* 126*  K 2.2* 2.9*  CL 88* 91*  CO2 28 29  GLUCOSE 131* 107*  BUN <5* 5*  CREATININE 0.48* 0.44*  CALCIUM 9.0 9.3    Patient remains lethargic, arouses to voice. Oriented to name, [place], and orthotic, but not to month or year. Follows commands. Moves all 4 extremities.  Plan: Continue supportive care.  Hewitt ShortsNUDELMAN,Andrue W, MD 02/26/2017, 9:48 AM
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
## 2017-02-26 NOTE — Progress Notes (Signed)
Spoke to WhitsettNick, Charity fundraiserN from 4N regarding patient being transferred to room 5C10. Awaiting arrival.

## 2017-02-27 LAB — BASIC METABOLIC PANEL
ANION GAP: 8 (ref 5–15)
BUN: 9 mg/dL (ref 6–20)
CALCIUM: 9.7 mg/dL (ref 8.9–10.3)
CO2: 26 mmol/L (ref 22–32)
Chloride: 92 mmol/L — ABNORMAL LOW (ref 101–111)
Creatinine, Ser: 0.47 mg/dL — ABNORMAL LOW (ref 0.61–1.24)
Glucose, Bld: 140 mg/dL — ABNORMAL HIGH (ref 65–99)
Potassium: 3.4 mmol/L — ABNORMAL LOW (ref 3.5–5.1)
Sodium: 126 mmol/L — ABNORMAL LOW (ref 135–145)

## 2017-02-27 LAB — CBC
HCT: 33.2 % — ABNORMAL LOW (ref 39.0–52.0)
Hemoglobin: 11.8 g/dL — ABNORMAL LOW (ref 13.0–17.0)
MCH: 30.6 pg (ref 26.0–34.0)
MCHC: 35.5 g/dL (ref 30.0–36.0)
MCV: 86.2 fL (ref 78.0–100.0)
Platelets: 297 10*3/uL (ref 150–400)
RBC: 3.85 MIL/uL — ABNORMAL LOW (ref 4.22–5.81)
RDW: 13.8 % (ref 11.5–15.5)
WBC: 5.2 10*3/uL (ref 4.0–10.5)

## 2017-02-27 MED ORDER — LEVETIRACETAM 500 MG PO TABS
500.0000 mg | ORAL_TABLET | Freq: Two times a day (BID) | ORAL | Status: DC
  Administered 2017-02-27 – 2017-03-10 (×22): 500 mg via ORAL
  Filled 2017-02-27 (×22): qty 1

## 2017-02-27 MED ORDER — POTASSIUM CHLORIDE CRYS ER 20 MEQ PO TBCR
40.0000 meq | EXTENDED_RELEASE_TABLET | ORAL | Status: AC
  Administered 2017-02-27 (×2): 40 meq via ORAL
  Filled 2017-02-27 (×2): qty 2

## 2017-02-27 MED ORDER — LORAZEPAM 1 MG PO TABS
1.0000 mg | ORAL_TABLET | Freq: Four times a day (QID) | ORAL | Status: DC | PRN
  Administered 2017-02-27 – 2017-03-07 (×12): 1 mg via ORAL
  Filled 2017-02-27 (×13): qty 1

## 2017-02-27 MED ORDER — SODIUM CHLORIDE 1 G PO TABS
1.0000 g | ORAL_TABLET | Freq: Three times a day (TID) | ORAL | Status: DC
  Administered 2017-02-27 – 2017-02-28 (×3): 1 g via ORAL
  Filled 2017-02-27 (×4): qty 1

## 2017-02-27 MED ORDER — LISINOPRIL 10 MG PO TABS
10.0000 mg | ORAL_TABLET | Freq: Every day | ORAL | Status: DC
  Administered 2017-02-27 – 2017-03-01 (×3): 10 mg via ORAL
  Filled 2017-02-27 (×3): qty 1
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
## 2017-02-27 NOTE — Progress Notes (Signed)
Pt seen and examined.  No issues overnight. No complaints this morning  EXAM: Temp:  [97.6 F (36.4 C)] 97.6 F (36.4 C) (06/30 0959) Pulse Rate:  [88-102] 101 (07/01 0440) Resp:  [18-20] 20 (07/01 0440) BP: (146-175)/(93-109) 146/101 (07/01 0440) SpO2:  [97 %-100 %] 100 % (07/01 0440) Intake/Output      06/30 0701 - 07/01 0700 07/01 0701 - 07/02 0700   P.O. 927    I.V. (mL/kg) 694.7 (10.3)    Total Intake(mL/kg) 1621.7 (24)    Urine (mL/kg/hr) 1600 (1)    Total Output 1600     Net +21.7            Lethargic but easily aroused Oriented to name and location but not year Follows commands throughout MAEW  Plan Stable Continue current care
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
## 2017-02-28 ENCOUNTER — Inpatient Hospital Stay (HOSPITAL_COMMUNITY): Payer: Self-pay

## 2017-02-28 ENCOUNTER — Encounter (HOSPITAL_COMMUNITY): Payer: Self-pay | Admitting: *Deleted

## 2017-02-28 DIAGNOSIS — E871 Hypo-osmolality and hyponatremia: Secondary | ICD-10-CM | POA: Diagnosis present

## 2017-02-28 DIAGNOSIS — R9431 Abnormal electrocardiogram [ECG] [EKG]: Secondary | ICD-10-CM

## 2017-02-28 DIAGNOSIS — D649 Anemia, unspecified: Secondary | ICD-10-CM

## 2017-02-28 DIAGNOSIS — Z720 Tobacco use: Secondary | ICD-10-CM | POA: Diagnosis present

## 2017-02-28 LAB — ECHOCARDIOGRAM COMPLETE
CHL CUP MV DEC (S): 349
E decel time: 349 msec
E/e' ratio: 8.18
FS: 21 % — AB (ref 28–44)
Height: 71 in
IV/PV OW: 0.88
LA ID, A-P, ES: 38 mm
LA diam end sys: 38 mm
LA diam index: 2.07 cm/m2
LA vol A4C: 24.4 ml
LA vol index: 16.4 mL/m2
LA vol: 30.1 mL
LV e' LATERAL: 6.53 cm/s
LVEEAVG: 8.18
LVEEMED: 8.18
LVOT area: 2.84 cm2
LVOTD: 19 mm
MVPKAVEL: 86.3 m/s
MVPKEVEL: 53.4 m/s
PW: 12 mm — AB (ref 0.6–1.1)
RV LATERAL S' VELOCITY: 15.1 cm/s
RV TAPSE: 17.1 mm
TDI e' lateral: 6.53
TDI e' medial: 5.74
Weight: 2384.5 oz

## 2017-02-28 LAB — URINALYSIS, ROUTINE W REFLEX MICROSCOPIC
BILIRUBIN URINE: NEGATIVE
GLUCOSE, UA: NEGATIVE mg/dL
KETONES UR: NEGATIVE mg/dL
Leukocytes, UA: NEGATIVE
Nitrite: NEGATIVE
PH: 8.5 — AB (ref 5.0–8.0)
Protein, ur: NEGATIVE mg/dL
SPECIFIC GRAVITY, URINE: 1.01 (ref 1.005–1.030)

## 2017-02-28 LAB — OSMOLALITY, URINE: OSMOLALITY UR: 563 mosm/kg (ref 300–900)

## 2017-02-28 LAB — URINALYSIS, MICROSCOPIC (REFLEX)

## 2017-02-28 LAB — FOLATE: Folate: 78 ng/mL (ref >5.9)

## 2017-02-28 LAB — IRON AND TIBC
Iron: 36 ug/dL — ABNORMAL LOW (ref 45–182)
SATURATION RATIOS: 14 % — AB (ref 17.9–39.5)
TIBC: 256 ug/dL (ref 250–450)
UIBC: 220 ug/dL

## 2017-02-28 LAB — CBC
HEMATOCRIT: 34 % — AB (ref 39.0–52.0)
Hemoglobin: 12 g/dL — ABNORMAL LOW (ref 13.0–17.0)
MCH: 30.5 pg (ref 26.0–34.0)
MCHC: 35.3 g/dL (ref 30.0–36.0)
MCV: 86.3 fL (ref 78.0–100.0)
PLATELETS: 322 10*3/uL (ref 150–400)
RBC: 3.94 MIL/uL — AB (ref 4.22–5.81)
RDW: 13.6 % (ref 11.5–15.5)
WBC: 7.2 10*3/uL (ref 4.0–10.5)

## 2017-02-28 LAB — HEPATIC FUNCTION PANEL
ALBUMIN: 3.4 g/dL — AB (ref 3.5–5.0)
ALK PHOS: 61 U/L (ref 38–126)
ALT: 40 U/L (ref 17–63)
AST: 28 U/L (ref 15–41)
BILIRUBIN TOTAL: 0.8 mg/dL (ref 0.3–1.2)
Bilirubin, Direct: 0.3 mg/dL (ref 0.1–0.5)
Indirect Bilirubin: 0.5 mg/dL (ref 0.3–0.9)
Total Protein: 6.2 g/dL — ABNORMAL LOW (ref 6.5–8.1)

## 2017-02-28 LAB — BASIC METABOLIC PANEL
ANION GAP: 6 (ref 5–15)
BUN: 7 mg/dL (ref 6–20)
CO2: 26 mmol/L (ref 22–32)
Calcium: 9.7 mg/dL (ref 8.9–10.3)
Chloride: 94 mmol/L — ABNORMAL LOW (ref 101–111)
Creatinine, Ser: 0.41 mg/dL — ABNORMAL LOW (ref 0.61–1.24)
GFR calc Af Amer: >60 mL/min (ref >60)
GLUCOSE: 118 mg/dL — AB (ref 65–99)
POTASSIUM: 3.7 mmol/L (ref 3.5–5.1)
Sodium: 126 mmol/L — ABNORMAL LOW (ref 135–145)

## 2017-02-28 LAB — SODIUM, URINE, RANDOM: SODIUM UR: 117 mmol/L

## 2017-02-28 LAB — TSH: TSH: 1.121 u[IU]/mL (ref 0.350–4.500)

## 2017-02-28 LAB — RETICULOCYTES
RBC.: 3.92 MIL/uL — AB (ref 4.22–5.81)
Retic Count, Absolute: 176.4 10*3/uL (ref 19.0–186.0)
Retic Ct Pct: 4.5 % — ABNORMAL HIGH (ref 0.4–3.1)

## 2017-02-28 LAB — OSMOLALITY: Osmolality: 264 mOsm/kg — ABNORMAL LOW (ref 275–295)

## 2017-02-28 LAB — FERRITIN: FERRITIN: 366 ng/mL — AB (ref 24–336)

## 2017-02-28 LAB — VITAMIN B12: Vitamin B-12: 1246 pg/mL — ABNORMAL HIGH (ref 180–914)

## 2017-02-28 MED ORDER — METOPROLOL TARTRATE 25 MG PO TABS
25.0000 mg | ORAL_TABLET | Freq: Two times a day (BID) | ORAL | Status: DC
  Administered 2017-02-28 – 2017-03-01 (×2): 25 mg via ORAL
  Filled 2017-02-28 (×2): qty 1

## 2017-02-28 MED ORDER — SODIUM CHLORIDE 0.9 % IV SOLN
INTRAVENOUS | Status: DC
  Administered 2017-02-28 – 2017-03-01 (×2): via INTRAVENOUS

## 2017-02-28 NOTE — Progress Notes (Signed)
OT Cancellation Note  Patient Details Name: Romelle StarcherRobert W Kistner MRN: [id number] DOB: 01/10/1961   Cancelled Treatment:    Reason Eval/Treat Not Completed: Patient at procedure or test/ unavailable (having ultrasound)  [facility]  Kasha Howeth, OT/L  [phone] 02/28/2017 02/28/2017, 3:52 PM
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
## 2017-02-28 NOTE — Progress Notes (Signed)
Pt will need SNF rehab at this time. I have updated Dr. Matthew FolksWyat, [facility] as well as SW, Marisue IvanLiz. We will sign off. [phone]

## 2017-02-28 NOTE — Progress Notes (Signed)
  Echocardiogram 2D Echocardiogram has been performed.  Thomas Hester 02/28/2017, 4:39 PM
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
## 2017-02-28 NOTE — Progress Notes (Signed)
I contacted pt's Mom, Consuella Loselaine, by phone [place]. [place]'s needs and assist with planning dispo. 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 54 year old son, Will, attends [employer] but has not had contact with his Dad for 3 years. Mom states she is his next of kin to assist with dispo at this time. I will discuss with SW for possible need for SNF.[phone]
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
## 2017-02-28 NOTE — NC FL2 (Signed)
[place] MEDICAID FL2 LEVEL OF CARE SCREENING TOOL     IDENTIFICATION  Patient Name: Thomas Hester Birthdate: Aug 24, 1961 Sex: male Admission Date (Current Location): 02/23/2017  [facility] and [place] Number:  Producer, television/film/video and Address:  The . [facility], [address], [place], [place] [address]      Provider Number: [id number]  Attending Physician Name and Address:  Md, Trauma, MD  Relative Name and Phone Number:       Current Level of Care: Hospital Recommended Level of Care: Skilled Nursing Facility Prior Approval Number:    Date Approved/Denied:   PASRR Number: [id number]  Discharge Plan: SNF    Current Diagnoses: Patient Active Problem List   Diagnosis Date Noted  . Acute hyponatremia 02/28/2017  . Tobacco abuse 02/28/2017  . Intracranial bleed (HCC)   . SDH (subdural hematoma) (HCC)   . TBI (traumatic brain injury) (HCC)   . ETOH abuse   . Tachycardia   . Uncontrolled hypertension   . Tachypnea   . Hyperglycemia   . Hypokalemia   . Hyponatremia   . Anemia     Orientation RESPIRATION BLADDER Height & Weight     Self, Place  Normal Incontinent, External catheter Weight: 149 lb 0.5 oz (67.6 kg) Height:  5\' 11"  (180.3 cm)  BEHAVIORAL SYMPTOMS/MOOD NEUROLOGICAL BOWEL NUTRITION STATUS      Continent    AMBULATORY STATUS COMMUNICATION OF NEEDS Skin   Extensive Assist Verbally Skin abrasions, Bruising                       Personal Care Assistance Level of Assistance  Bathing, Feeding, Dressing Bathing Assistance: Maximum assistance Feeding assistance: Limited assistance Dressing Assistance: Maximum assistance     Functional Limitations Info  Speech     Speech Info: Impaired (dysarthria)    SPECIAL CARE FACTORS FREQUENCY  PT (By licensed PT), OT (By licensed OT)     PT Frequency: 5x/wk OT Frequency: 5x/wk            Contractures      Additional Factors Info  Code Status, Allergies Code Status Info:  Full Allergies Info: NKA           Current Medications (02/28/2017):  This is the current hospital active medication list Current Facility-Administered Medications  Medication Dose Route Frequency Provider Last Rate Last Dose  . 0.9 %  sodium chloride infusion   Intravenous Continuous Rama, Maryruth Bun, MD 100 mL/hr at 02/28/17 1349    . acetaminophen (TYLENOL) tablet 650 mg  650 mg Oral Q4H PRN Ulice Dash, PA-C   650 mg at 02/28/17 0000   Or  . acetaminophen (TYLENOL) solution 650 mg  650 mg Per Tube Q4H PRN Ulice Dash, PA-C       Or  . acetaminophen (TYLENOL) suppository 650 mg  650 mg Rectal Q4H PRN Ulice Dash, PA-C      . chlorhexidine (PERIDEX) 0.12 % solution 15 mL  15 mL Mouth Rinse BID Caryl Pina, MD   15 mL at 02/28/17 0930  . feeding supplement (ENSURE ENLIVE) (ENSURE ENLIVE) liquid 237 mL  237 mL Oral TID BM Violeta Gelinas, MD   237 mL at 02/28/17 1400  . folic acid (FOLVITE) tablet 1 mg  1 mg Oral Daily Violeta Gelinas, MD   1 mg at 02/28/17 0930  . hydrALAZINE (APRESOLINE) injection 10 mg  10 mg Intravenous Q4H PRN Violeta Gelinas, MD   10  mg at 02/27/17 2029  . levETIRAcetam (KEPPRA) tablet 500 mg  500 mg Oral Q12H Emelia LoronWakefield, Matthew, MD   500 mg at 02/28/17 [id number]  . lisinopril (PRINIVIL,ZESTRIL) tablet 10 mg  10 mg Oral Daily Phylliss Blakesonnor, Chelsea A, MD   10 mg at 02/28/17 0930  . LORazepam (ATIVAN) injection 1-2 mg  1-2 mg Intravenous Q1H PRN Ulice DashSmith, David R, PA-C   1 mg at 02/28/17 0002  . LORazepam (ATIVAN) tablet 1 mg  1 mg Oral Q6H PRN Phylliss Blakesonnor, Chelsea A, MD   1 mg at 02/27/17 1630  . metoprolol tartrate (LOPRESSOR) tablet 25 mg  25 mg Oral BID Russella DarEllis, Allison L, NP      . multivitamin with minerals tablet 1 tablet  1 tablet Oral Daily Violeta Gelinashompson, Burke, MD   1 tablet at 02/28/17 0930  . pantoprazole (PROTONIX) EC tablet 40 mg  40 mg Oral Daily Baldemar FridayMasters, Alison M, [place]   40 mg at 02/27/17 2031  . senna-docusate (Senokot-S) tablet 1 tablet  1 tablet Oral BID Ulice DashSmith, David  R, PA-C   1 tablet at 02/28/17 0930  . thiamine (VITAMIN B-1) tablet 100 mg  100 mg Oral Daily Violeta Gelinashompson, Burke, MD   100 mg at 02/28/17 0930     Discharge Medications: Please see discharge summary for a list of discharge medications.  Relevant Imaging Results:  Relevant Lab Results:   Additional Information SS#: [id number]  Baldemar LenisElizabeth M Endy Easterly, LCSW
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
## 2017-02-28 NOTE — Progress Notes (Signed)
Unable to complete orthostatic VS. PT unable to sit up and stay up or stand-he was leaning on staff.

## 2017-03-01 ENCOUNTER — Encounter (HOSPITAL_COMMUNITY): Payer: Self-pay | Admitting: Physician Assistant

## 2017-03-01 DIAGNOSIS — I426 Alcoholic cardiomyopathy: Secondary | ICD-10-CM

## 2017-03-01 DIAGNOSIS — I420 Dilated cardiomyopathy: Secondary | ICD-10-CM

## 2017-03-01 DIAGNOSIS — F1010 Alcohol abuse, uncomplicated: Secondary | ICD-10-CM | POA: Diagnosis present

## 2017-03-01 LAB — CBC
HCT: 32.1 % — ABNORMAL LOW (ref 39.0–52.0)
Hemoglobin: 11.1 g/dL — ABNORMAL LOW (ref 13.0–17.0)
MCH: 30 pg (ref 26.0–34.0)
MCHC: 34.6 g/dL (ref 30.0–36.0)
MCV: 86.8 fL (ref 78.0–100.0)
PLATELETS: 308 10*3/uL (ref 150–400)
RBC: 3.7 MIL/uL — ABNORMAL LOW (ref 4.22–5.81)
RDW: 13.7 % (ref 11.5–15.5)
WBC: 6.8 10*3/uL (ref 4.0–10.5)

## 2017-03-01 LAB — BASIC METABOLIC PANEL
Anion gap: 6 (ref 5–15)
BUN: 8 mg/dL (ref 6–20)
CALCIUM: 9.1 mg/dL (ref 8.9–10.3)
CO2: 25 mmol/L (ref 22–32)
CREATININE: 0.46 mg/dL — AB (ref 0.61–1.24)
Chloride: 93 mmol/L — ABNORMAL LOW (ref 101–111)
GFR calc Af Amer: >60 mL/min (ref >60)
GFR calc non Af Amer: >60 mL/min (ref >60)
GLUCOSE: 98 mg/dL (ref 65–99)
Potassium: 3.8 mmol/L (ref 3.5–5.1)
Sodium: 124 mmol/L — ABNORMAL LOW (ref 135–145)

## 2017-03-01 LAB — HEMOGLOBIN A1C
HEMOGLOBIN A1C: 4.7 % — AB (ref 4.8–5.6)
Mean Plasma Glucose: 88 mg/dL

## 2017-03-01 LAB — AMMONIA: AMMONIA: 32 umol/L (ref 9–35)

## 2017-03-01 MED ORDER — LISINOPRIL 20 MG PO TABS
20.0000 mg | ORAL_TABLET | Freq: Every day | ORAL | Status: DC
  Administered 2017-03-02 – 2017-03-10 (×9): 20 mg via ORAL
  Filled 2017-03-01 (×9): qty 1

## 2017-03-01 MED ORDER — METOPROLOL SUCCINATE ER 25 MG PO TB24
50.0000 mg | ORAL_TABLET | Freq: Every day | ORAL | Status: DC
  Administered 2017-03-01 – 2017-03-10 (×10): 50 mg via ORAL
  Filled 2017-03-01 (×10): qty 2

## 2017-03-01 MED ORDER — OXYCODONE HCL 5 MG PO TABS
5.0000 mg | ORAL_TABLET | Freq: Once | ORAL | Status: AC
  Administered 2017-03-01: 5 mg via ORAL
  Filled 2017-03-01: qty 1

## 2017-03-01 MED ORDER — OXYCODONE HCL 5 MG PO TABS
5.0000 mg | ORAL_TABLET | ORAL | Status: DC | PRN
  Administered 2017-03-02: 5 mg via ORAL
  Administered 2017-03-04 – 2017-03-07 (×2): 10 mg via ORAL
  Filled 2017-03-01: qty 2
  Filled 2017-03-01: qty 1
  Filled 2017-03-01: qty 2
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
## 2017-03-01 NOTE — Progress Notes (Signed)
PM progress note  Pt having more difficulty staying alert and participating this pm.  OT/PT encouraged maximally to get mobility in standing, but eventually challenged pt with 2 graded ADL/IADL tasks sitting fully upright in the bed.  Pt needed consisted redirection to stay on task.   03/01/17 1700  PT Visit Information  Last PT Received On 03/01/17  Assistance Needed +2  PT/OT/SLP Co-Evaluation/Treatment Yes  Reason for Co-Treatment Complexity of the patient's impairments (multi-system involvement);To address functional/ADL transfers  PT goals addressed during session Mobility/safety with mobility  Subjective Data  Subjective Just let me lay down  Patient Stated Goal None stated  Precautions  Precautions Fall  Pain Assessment  Pain Assessment Faces  Faces Pain Scale 2  Pain Location variable pain  Pain Descriptors / Indicators Grimacing  Pain Intervention(s) Monitored during session  Cognition  Arousal/Alertness Lethargic  Behavior During Therapy Impulsive  Overall Cognitive Status Impaired/Different from baseline  Area of Impairment Safety/judgement;Problem solving;Following commands;Attention;Rancho level;Orientation  Orientation Level Time;Situation  Current Attention Level Sustained  Following Commands Follows one step commands with increased time  Safety/Judgement Decreased awareness of safety;Decreased awareness of deficits  Problem Solving Slow processing;Difficulty sequencing;Requires verbal cues;Requires tactile cues  Rancho Levels of Cognitive Functioning  Rancho Los Amigos Scales of Cognitive Functioning V  Bed Mobility  Overal bed mobility Needs Assistance  Bed Mobility Supine to Sit;Sit to Supine  Supine to sit Min assist  Sit to supine Min guard  General bed mobility comments assist/cues more directional than needing alot of assist.  pt more resistive this session because he wanted to be left alone to sleep  Transfers  Overall transfer level Needs assistance   Transfers Sit to/from Stand;Stand Pivot Transfers  Sit to Stand Mod assist;+2 safety/equipment  General transfer comment cues v/t for direction and re direction to task.  assist for assist in standing  Ambulation/Gait  General Gait Details Attempt to ambulate to sink for adl task, but pt unable to coordinate his feet to ambulate and kept resisting to get back in bed.  Modified Rankin (Stroke Patients Only)  Pre-Morbid Rankin Score 0  Modified Rankin 4  Balance  Overall balance assessment Needs assistance  Sitting-balance support Feet supported  Sitting balance-Leahy Scale Fair  Sitting balance - Comments pt still not sitting upright long enough to get a good sense of his balance.  Standing balance support Bilateral upper extremity supported  Standing balance-Leahy Scale Poor  General Comments  General comments (skin integrity, edema, etc.) In lieu of mobilizing pt, OT/PT set up a and ADL task  (brushing teeth).  Pt unable to divide his attension between holding the toothpaste or the toothbrush, unscrewing the cap and holding onto the brush, but he did execute brushing well..  OT also set up another cognitive task with menu and graded the difficulty to guage some of the pt's cognitive deficits.  PT - End of Session  Activity Tolerance Patient limited by fatigue;Patient limited by lethargy  Patient left in bed;with call bell/phone within reach;with bed alarm set  Nurse Communication Mobility status  PT - Assessment/Plan  PT Plan Current plan remains appropriate  PT Visit Diagnosis Unsteadiness on feet (R26.81);Other abnormalities of gait and mobility (R26.89);Other symptoms and signs involving the nervous system (R29.898)  PT Frequency (ACUTE ONLY) Min 4X/week  Recommendations for Other Services Rehab consult  Follow Up Recommendations CIR  PT equipment Rolling walker with 5" wheels  AM-PAC PT "6 Clicks" Daily Activity Outcome Measure  Difficulty turning over in bed (including  adjusting  bedclothes, sheets and blankets)? 3  Difficulty moving from lying on back to sitting on the side of the bed?  1  Difficulty sitting down on and standing up from a chair with arms (e.g., wheelchair, bedside commode, etc,.)? 1  Help needed moving to and from a bed to chair (including a wheelchair)? 2  Help needed walking in hospital room? 2  Help needed climbing 3-5 steps with a railing?  1  6 Click Score 10  Mobility G Code  CL  PT Goal Progression  Progress towards PT goals Progressing toward goals  Acute Rehab PT Goals  PT Goal Formulation With patient  Time For Goal Achievement 03/10/17  Potential to Achieve Goals Good  PT Time Calculation  PT Start Time (ACUTE ONLY) 1440  PT Stop Time (ACUTE ONLY) 1506  PT Time Calculation (min) (ACUTE ONLY) 26 min  PT General Charges  $$ ACUTE PT VISIT 1 Procedure  PT Treatments  $Therapeutic Activity 8-22 mins  03/01/2017  Bison Bing, PT [phone]  (pager)
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
## 2017-03-01 NOTE — Progress Notes (Addendum)
[facility]   ECHO reviewed EF noted to be 35-40% with inferior and inferoseptal hypokinesis. Will request cardiology eval.  Thomas CantorSaima Regena Delucchi, MD

## 2017-03-02 DIAGNOSIS — D500 Iron deficiency anemia secondary to blood loss (chronic): Secondary | ICD-10-CM

## 2017-03-02 LAB — CBC
HEMATOCRIT: 33.8 % — AB (ref 39.0–52.0)
HEMOGLOBIN: 11.6 g/dL — AB (ref 13.0–17.0)
MCH: 29.9 pg (ref 26.0–34.0)
MCHC: 34.3 g/dL (ref 30.0–36.0)
MCV: 87.1 fL (ref 78.0–100.0)
Platelets: 300 10*3/uL (ref 150–400)
RBC: 3.88 MIL/uL — ABNORMAL LOW (ref 4.22–5.81)
RDW: 13.8 % (ref 11.5–15.5)
WBC: 6.5 10*3/uL (ref 4.0–10.5)

## 2017-03-02 LAB — BASIC METABOLIC PANEL
Anion gap: 7 (ref 5–15)
BUN: 8 mg/dL (ref 6–20)
CALCIUM: 9.1 mg/dL (ref 8.9–10.3)
CHLORIDE: 94 mmol/L — AB (ref 101–111)
CO2: 25 mmol/L (ref 22–32)
CREATININE: 0.51 mg/dL — AB (ref 0.61–1.24)
GFR calc Af Amer: >60 mL/min (ref >60)
GFR calc non Af Amer: >60 mL/min (ref >60)
GLUCOSE: 83 mg/dL (ref 65–99)
Potassium: 3.6 mmol/L (ref 3.5–5.1)
Sodium: 126 mmol/L — ABNORMAL LOW (ref 135–145)

## 2017-03-02 MED ORDER — ATORVASTATIN CALCIUM 10 MG PO TABS
20.0000 mg | ORAL_TABLET | Freq: Every day | ORAL | Status: DC
  Administered 2017-03-02 – 2017-03-09 (×8): 20 mg via ORAL
  Filled 2017-03-02 (×8): qty 2

## 2017-03-02 MED ORDER — NICOTINE 21 MG/24HR TD PT24
21.0000 mg | MEDICATED_PATCH | Freq: Every day | TRANSDERMAL | Status: DC
  Administered 2017-03-02 – 2017-03-10 (×9): 21 mg via TRANSDERMAL
  Filled 2017-03-02 (×9): qty 1

## 2017-03-02 NOTE — Clinical Social Work Note (Signed)
Clinical Social Work Assessment  Patient Details  Name: Thomas StarcherRobert W Hester MRN: [id number] Date of Birth: 10/21/1960  Date of referral:  03/01/17               Reason for consult:  Facility Placement, Discharge Planning, [facility], Financial Concerns, Insurance Barriers                Permission sought to share information with:  Facility Medical sales representativeContact Representative, Family Supports Permission granted to share information::  Yes, Verbal Permission Granted  Name::     Thomas Hester  Agency::  SNF  Relationship::  Mother  Contact Information:     Housing/Transportation Living arrangements for the past 2 months:  Single Family Home Source of Information:  Parent Patient Interpreter Needed:  None Criminal Activity/Legal Involvement Pertinent to Current Situation/Hospitalization:  No - Comment as needed Significant Relationships:  Siblings, Parents Lives with:  Self Do you feel safe going back to the place where you live?  Yes Need for family participation in patient care:  Yes (Comment) (patient not currently oriented)  Care giving concerns:  Patient has been living at home alone, but presently requires supervision and short term rehab to be able to function more independently.    Social Worker assessment / plan:  CSW introduced self to patient's mother over the phone, and explained role. CSW explained placement options given the patient's lack of insurance, and the Letter of Guarantee that the hospital would be able to provide in order to obtain short-term rehab for the patient. Patient's mother indicated understanding, and appreciated the assistance. Patient's mother requested information on how to begin a Medicaid application. Patient's mother requested an update from the doctor on medical status. Patient's mother discussed with CSW the long-term plan and thinking about getting [place] back [place] where he [place] closer to family. Patient's mother indicated that she and her  daughter would be arriving to the hospital on Friday afternoon to assist with disposition planning. CSW obtained approval for 30-day LOG placement, and faxed out referral to local facilities that will accept 30-day LOG. CS[facility] in order to put the patient on the facility's radar and determine bed offer for LOG placement. CSW will follow to facilitate discharge planning and SNF placement.  Employment status:  [employer] Insurance information:  Other (Comment Required) (No insurance) PT Recommendations:  Inpatient Rehab Consult Information / Referral to community resources:  Skilled Nursing Facility  Patient/Family's Response to care:  Patient's mother agreeable to SNF placement.  Patient/Family's Understanding of and Emotional Response to Diagnosis, Current Treatment, and Prognosis:  Patient's mother acknowledged feeling overwhelmed with the current situation. Patient's mother appreciated the help, and indicated that she would gladly accept any assistance that she could with getting the patient the help that he needed. Patient's mother seems to understand the patient's current diagnosis and condition, but is concerned about prognosis and future needs. Patient's mother indicated understanding of CSW role in discharge planning.  Emotional Assessment Appearance:  Appears stated age Attitude/Demeanor/Rapport:  Unable to Assess Affect (typically observed):  Unable to Assess Orientation:  Oriented to Self, Oriented to Place Alcohol / Substance use:  Alcohol Use Psych involvement (Current and /or in the community):  No (Comment)  Discharge Needs  Concerns to be addressed:  Discharge Planning Concerns, Care Coordination, Financial / Insurance Concerns Readmission within the last 30 days:  No Current discharge risk:  Lives alone, Physical Impairment, Cognitively Impaired, Substance Abuse, Lack of  support system Barriers to Discharge:  Continued Medical  Work up, Active Substance Use, Inadequate or no insurance   Thomas Hester, [place] 03/02/2017, 11:52 AM
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
## 2017-03-02 NOTE — Progress Notes (Signed)
Patient very restless, undressing multiple times. Patient requesting his pants in order to get his wallet to buy some cigarettes. Patient reoriented, repositioned. Patient requesting a nicotine patch. TRH paged. Patient bed alarm activated, fall precautions in place. RN will continue to monitor.

## 2017-03-02 NOTE — Progress Notes (Signed)
Patient states he smoke 1-2 packs cigaarettes daily. 21 mg, transdermal Nicoderm daily starting tonight per Schorr entered. RN will continue to monitor patient.
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
## 2017-03-03 LAB — BASIC METABOLIC PANEL
ANION GAP: 4 — AB (ref 5–15)
BUN: 11 mg/dL (ref 6–20)
CHLORIDE: 93 mmol/L — AB (ref 101–111)
CO2: 28 mmol/L (ref 22–32)
CREATININE: 0.53 mg/dL — AB (ref 0.61–1.24)
Calcium: 9.1 mg/dL (ref 8.9–10.3)
GFR calc non Af Amer: >60 mL/min (ref >60)
Glucose, Bld: 103 mg/dL — ABNORMAL HIGH (ref 65–99)
Potassium: 3.8 mmol/L (ref 3.5–5.1)
SODIUM: 125 mmol/L — AB (ref 135–145)

## 2017-03-03 MED ORDER — BACITRACIN-NEOMYCIN-POLYMYXIN OINTMENT TUBE
TOPICAL_OINTMENT | Freq: Two times a day (BID) | CUTANEOUS | Status: DC
  Administered 2017-03-03 – 2017-03-10 (×15): via TOPICAL
  Filled 2017-03-03: qty 14.17

## 2017-03-03 MED ORDER — POLYETHYLENE GLYCOL 3350 17 G PO PACK
17.0000 g | PACK | Freq: Every day | ORAL | Status: DC
  Administered 2017-03-03 – 2017-03-09 (×7): 17 g via ORAL
  Filled 2017-03-03 (×7): qty 1

## 2017-03-03 MED ORDER — ASPIRIN EC 81 MG PO TBEC
81.0000 mg | DELAYED_RELEASE_TABLET | Freq: Every day | ORAL | Status: DC
  Administered 2017-03-03 – 2017-03-10 (×8): 81 mg via ORAL
  Filled 2017-03-03 (×8): qty 1

## 2017-03-03 MED ORDER — ENOXAPARIN SODIUM 40 MG/0.4ML ~~LOC~~ SOLN
40.0000 mg | SUBCUTANEOUS | Status: DC
  Administered 2017-03-03 – 2017-03-09 (×7): 40 mg via SUBCUTANEOUS
  Filled 2017-03-03 (×7): qty 0.4

## 2017-03-03 NOTE — Progress Notes (Signed)
Spoke with Dr. Franky Machoabbell, neurosurgery, and he feels it is okay to start lovneox for DVT prophylaxis and ASA per the request of cardiology.   Mattie MarlinJessica Shalunda Lindh, [facility] Pager [phone]
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
## 2017-03-03 NOTE — Progress Notes (Signed)
Pt mentioned today that he has a dog, which was concerning to staff, as they were not sure if dog was being cared for.  I called his mother, and she assured me that Mr. Thomas Hester's dog is safe and being cared for by a neighbor, Josh.    Quintella BatonJulie W. Minerva Bluett, RN, BSN  Trauma/Neuro ICU Case Manager [phone]

## 2017-03-03 NOTE — Progress Notes (Addendum)
[facility] with no difficulties. Patient attempted to have BM with no results but did urinate 400 mL. Patient LBM 6/30. Patient denies abdominal pain or cramping. MD paged. RN will continue to monitor.
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
## 2017-03-03 NOTE — Progress Notes (Signed)
Patient removed condum catheter x 2. [facility] with no difficulties. Patient leaning forward slightly when ambulating back to bed. Patient did not urinate or have BM. Patient provided urinal and encouraged to call staff for assistance when needed. RN will continue to monitor.
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
## 2017-03-04 LAB — BASIC METABOLIC PANEL
Anion gap: 5 (ref 5–15)
BUN: 13 mg/dL (ref 6–20)
CHLORIDE: 94 mmol/L — AB (ref 101–111)
CO2: 30 mmol/L (ref 22–32)
CREATININE: 0.6 mg/dL — AB (ref 0.61–1.24)
Calcium: 9.2 mg/dL (ref 8.9–10.3)
GFR calc non Af Amer: >60 mL/min (ref >60)
GLUCOSE: 93 mg/dL (ref 65–99)
Potassium: 4.5 mmol/L (ref 3.5–5.1)
Sodium: 129 mmol/L — ABNORMAL LOW (ref 135–145)
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
## 2017-03-04 NOTE — Progress Notes (Signed)
CSW met with patient's mother, Margaretha Sheffield, and sister, Santiago Glad, at bedside. CSW discussed discharge planning, and what the family was thinking in terms of discharge options. Patient's mother discussed that they would like to pursue the LOG for rehab placement for the patient to receive physical therapy and supervision for the next 30 days, and then bring the patient back down to [place] to pursue a rehab stay for the patient's alcohol abuse and a halfway house. Patient's mother explained how the patient has been in a halfway house in the past, and it had worked out really well for him.   CSW contacted representative, Gerald Stabs, with [employer] and [employer] in order to discuss the 30-day LOG, and the denial of the request that was sent. Representative discussed that he would investigate the denial and see if anything else could be done to accept the referral and offer a bed.   Patient's mother also requested information on completing a Medicaid application for the patient. CSW contacted financial counseling office, and requested that the financial counselor who was following the case come to meet with the family at bedside to begin the process.   CSW will continue to follow to facilitate discharge needs.  Laveda Abbe, [place] Clinical Social Worker [phone]

## 2017-03-04 NOTE — Progress Notes (Signed)
Consult follow up note   Thomas Hester   RUE:[id number]  DOB: Dec 28, 1960  DOA: 02/23/2017 PCP: Stevphen Rochester, MD   Brief Narrative:  Thomas Hester is a 56 y.o. male with medical history significant for untreated hypertension and alcoholism. 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 Trauma service and neurosurgery additionally consulted. Nonsurgical management of head injury in process. Initially found to have hyponatremia (sodium 123) as well as hypokalemia. Subsequently placed on a  1200 mL of fluid restriction. Because of alcoholism was placed on CIWA protocol. He was begun on low-dose beta blockers for uncontrolled blood pressure as well as persistent tachycardia as well as prn IV beta blockers and hydralazine. Sodium briefly improved from 123 to 131 but has subsequently decreased.Now 129 blood pressure remains controlled. Lisinopril was started   on 7/1.  Subjective: Patient continues to be intermittently confused but awake, still has a headache  Assessment & Plan:  Essential hypertension-controlled Lisinopril 20 mg, Toprol XL 50, blood pressure stable PRN IV Hydralazine-   Subdural hematoma-management per trauma team, repeat CT head is stable, continue Keppra for seizure prophylaxis,Dr. Cabbell and Dr. Pearlean Brownie following   Euvolemic Hyponatremia, likely exacerbated by cardiomyopathy, likely alcohol induced Improving, sodium now 129 lab work to determine the cause of SIADH is likely inaccurate in setting of IV normal saline infusion and sodium tabs Normal saline discontinued and patient placed on 1200 mL fluid restriction TSH 1.121    Cardiomyopathy with focal wall motion abnormality - EF of 35-40%-cardiology consulted-differential diagnosis includes uncontrolled hypertension versus alcohol use. Currently not a candidate for extensive evaluation given traumatic brain injury Cardiology  recommended  outpt follow up and repeat ECHO in 3 months cont ACE, B blocker- could use a baby aspirin , statin also initiated,  Lipitor 20 mg a day Please provide outpatient referral to follow up with Dr. Jens Som, Arlys John   , at the time of discharge  Alcohol abuse - interestingly drinks 3 beers a day  Liver function has improved   Elevated LFTs on admission - likely due to alcohol abuse - normalized  Hypokalemia - has resolved with repletion- follow oral intake  Anemia- likely acute blood loss - Hb 16.2 in 01/08/14 - now 10-12 range - anemia panel reviewed - sufficient Iron, folic acid and B12 stores- Retic count appropriately elevated   Tobacco abuse - needs to quit   Traumatic brain injury, Face and head trauma, intracranial bleeding  - per Trauma surgery, neurosurgery    DVT prophylaxis: SCDS,lovenox    Antimicrobials:  Anti-infectives    Start     Dose/Rate Route Frequency Ordered Stop   02/23/17 1445  ceFAZolin (ANCEF) IVPB 2g/100 mL premix     2 g 200 mL/hr over 30 Minutes Intravenous  Once 02/23/17 1431 02/23/17 1654       Objective: Vitals:   03/03/17 0910 03/03/17 2058 03/04/17 0049 03/04/17 0531  BP: 119/76 123/81 126/85 122/86  Pulse: 70 78 85 80  Resp: 20 18 18 18   Temp: 97.7 F (36.5 C) (!) 97.4 F (36.3 C) 98.4 F (36.9 C) 97.9 F (36.6 C)  TempSrc: Oral Oral Oral Oral  SpO2: 100% 100% 100% 100%  Weight:      Height:        Intake/Output Summary (Last 24 hours) at 03/04/17 0853 Last data filed at 03/03/17 2337  Gross per 24 hour  Intake  400 ml  Output                0 ml  Net              400 ml   Filed Weights   02/23/17 1203 02/23/17 1800 02/24/17 0400  Weight: 75.8 kg (167 lb) 70.3 kg (155 lb) 67.6 kg (149 lb 0.5 oz)    Examination: General exam: Appears comfortable  HEENT: PERRLA, oral mucosa moist, no sclera icterus or thrush Respiratory system: Clear to auscultation. Respiratory effort normal. Cardiovascular system: S1 & S2  heard, RRR.  No murmurs  Gastrointestinal system: Abdomen soft, non-tender, nondistended. Normal bowel sound. No organomegaly Central nervous system: Alert and oriented. No focal neurological deficits. Extremities: No cyanosis, clubbing or edema Skin: No rashes or ulcers Psychiatry:  Mood & affect appropriate.     Data Reviewed: I have personally reviewed following labs and imaging studies  CBC:  Recent Labs Lab 02/26/17 0306 02/27/17 0218 02/28/17 0245 03/01/17 0310 03/02/17 0345  WBC 4.1 5.2 7.2 6.8 6.5  HGB 10.6* 11.8* 12.0* 11.1* 11.6*  HCT 29.7* 33.2* 34.0* 32.1* 33.8*  MCV 85.3 86.2 86.3 86.8 87.1  PLT 255 297 322 308 300   Basic Metabolic Panel:  Recent Labs Lab 02/28/17 0245 03/01/17 0310 03/02/17 0345 03/03/17 0314 03/04/17 0600  NA 126* 124* 126* 125* 129*  K 3.7 3.8 3.6 3.8 4.5  CL 94* 93* 94* 93* 94*  CO2 26 25 25 28 30   GLUCOSE 118* 98 83 103* 93  BUN 7 8 8 11 13   CREATININE 0.41* 0.46* 0.51* 0.53* 0.60*  CALCIUM 9.7 9.1 9.1 9.1 9.2   GFR: Estimated Creatinine Clearance: 100.9 mL/min (A) (by C-G formula based on SCr of 0.6 mg/dL (L)). Liver Function Tests:  Recent Labs Lab 02/28/17 1211  AST 28  ALT 40  ALKPHOS 61  BILITOT 0.8  PROT 6.2*  ALBUMIN 3.4*   No results for input(s): LIPASE, AMYLASE in the last 168 hours.  Recent Labs Lab 03/01/17 0310  AMMONIA 32   Coagulation Profile: No results for input(s): INR, PROTIME in the last 168 hours. Cardiac Enzymes: No results for input(s): CKTOTAL, CKMB, CKMBINDEX, TROPONINI in the last 168 hours. BNP (last 3 results) No results for input(s): PROBNP in the last 8760 hours. HbA1C: No results for input(s): HGBA1C in the last 72 hours. CBG: No results for input(s): GLUCAP in the last 168 hours. Lipid Profile: No results for input(s): CHOL, HDL, LDLCALC, TRIG, CHOLHDL, LDLDIRECT in the last 72 hours. Thyroid Function Tests: No results for input(s): TSH, T4TOTAL, FREET4, T3FREE, THYROIDAB  in the last 72 hours. Anemia Panel: No results for input(s): VITAMINB12, FOLATE, FERRITIN, TIBC, IRON, RETICCTPCT in the last 72 hours. Urine analysis:    Component Value Date/Time   COLORURINE YELLOW 02/28/2017 1237   APPEARANCEUR HAZY (A) 02/28/2017 1237   LABSPEC 1.010 02/28/2017 1237   PHURINE 8.5 (H) 02/28/2017 1237   GLUCOSEU NEGATIVE 02/28/2017 1237   HGBUR MODERATE (A) 02/28/2017 1237   BILIRUBINUR NEGATIVE 02/28/2017 1237   KETONESUR NEGATIVE 02/28/2017 1237   PROTEINUR NEGATIVE 02/28/2017 1237   UROBILINOGEN 0.2 03/15/2007 1720   NITRITE NEGATIVE 02/28/2017 1237   LEUKOCYTESUR NEGATIVE 02/28/2017 1237   Sepsis Labs: @LABRCNTIP (procalcitonin:4,lacticidven:4) ) Recent Results (from the past 240 hour(s))  MRSA PCR Screening     Status: None   Collection Time: 02/23/17  5:54 PM  Result Value Ref Range Status   MRSA by PCR NEGATIVE NEGATIVE Final  Comment:        The GeneXpert MRSA Assay (FDA approved for NASAL specimens only), is one component of a comprehensive MRSA colonization surveillance program. It is not intended to diagnose MRSA infection nor to guide or monitor treatment for MRSA infections.          Radiology Studies: No results found.    Scheduled Meds: . aspirin EC  81 mg Oral Daily  . atorvastatin  20 mg Oral q1800  . chlorhexidine  15 mL Mouth Rinse BID  . enoxaparin (LOVENOX) injection  40 mg Subcutaneous Q24H  . feeding supplement (ENSURE ENLIVE)  237 mL Oral TID BM  . folic acid  1 mg Oral Daily  . levETIRAcetam  500 mg Oral Q12H  . lisinopril  20 mg Oral Daily  . metoprolol succinate  50 mg Oral Daily  . multivitamin with minerals  1 tablet Oral Daily  . neomycin-bacitracin-polymyxin   Topical BID  . nicotine  21 mg Transdermal Daily  . pantoprazole  40 mg Oral Daily  . polyethylene glycol  17 g Oral Daily  . senna-docusate  1 tablet Oral BID  . thiamine  100 mg Oral Daily   Continuous Infusions:    LOS: 9 days    Time  spent in minutes: 35     Richarda Overlie, MD [facility] Pager: [email or web address] Password [email or web address] 03/04/2017, 8:53 AM

## 2017-03-05 LAB — COMPREHENSIVE METABOLIC PANEL
ALK PHOS: 72 U/L (ref 38–126)
ALT: 28 U/L (ref 17–63)
ANION GAP: 5 (ref 5–15)
AST: 23 U/L (ref 15–41)
Albumin: 3.1 g/dL — ABNORMAL LOW (ref 3.5–5.0)
BUN: 11 mg/dL (ref 6–20)
CALCIUM: 9.1 mg/dL (ref 8.9–10.3)
CHLORIDE: 96 mmol/L — AB (ref 101–111)
CO2: 27 mmol/L (ref 22–32)
CREATININE: 0.55 mg/dL — AB (ref 0.61–1.24)
Glucose, Bld: 95 mg/dL (ref 65–99)
Potassium: 4.5 mmol/L (ref 3.5–5.1)
SODIUM: 128 mmol/L — AB (ref 135–145)
Total Bilirubin: 0.6 mg/dL (ref 0.3–1.2)
Total Protein: 5.6 g/dL — ABNORMAL LOW (ref 6.5–8.1)

## 2017-03-05 NOTE — Progress Notes (Signed)
Consult follow up note   Thomas StarcherRobert W Myrick   XBJ:[id number]  DOB: 07/29/1961  DOA: 02/23/2017 PCP: Stevphen RochesterLebauer, Eugene, MD   Brief Narrative:  Thomas Hester is a 56 y.o. male with medical history significant for untreated hypertension and alcoholism. 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 Trauma service and neurosurgery additionally consulted. Nonsurgical management of head injury in process. Initially found to have hyponatremia (sodium 123) as well as hypokalemia. Subsequently placed on a  1200 mL of fluid restriction. Because of alcoholism was placed on CIWA protocol. He was begun on low-dose beta blockers for uncontrolled blood pressure as well as persistent tachycardia as well as prn IV beta blockers and hydralazine. Sodium briefly improved from 123 to 131 but has subsequently decreased.Now 129 blood pressure remains controlled. Lisinopril was started   on 7/1.  Subjective: No acute events reported overnight. Patient is resting comfortably no acute distress. He is alert and responsive  Assessment & Plan:  Essential hypertension-controlled Lisinopril 20 mg, Toprol XL 50, blood pressure stable PRN IV Hydralazine-   Subdural hematoma-management per trauma team, repeat CT head is stable, continue Keppra for seizure prophylaxis,Dr. Cabbell and Dr. Pearlean BrownieSethi following   Euvolemic Hyponatremia, likely exacerbated by cardiomyopathy, likely alcohol induced Improving lab work to determine the cause of SIADH is likely inaccurate in setting of IV normal saline infusion and sodium tabs Normal saline discontinued and patient placed on 1200 mL fluid restriction TSH 1.121    Cardiomyopathy with focal wall motion abnormality - EF of 35-40%-cardiology consulted-differential diagnosis includes uncontrolled hypertension versus alcohol use. Currently not a candidate for extensive evaluation given traumatic brain  injury Cardiology  recommended outpt follow up and repeat ECHO in 3 months cont ACE, B blocker- could use a baby aspirin , statin also initiated,  Lipitor 20 mg a day Please provide outpatient referral to follow up with Dr. Jens Somrenshaw, Arlys JohnBrian   , at the time of discharge  Alcohol abuse - interestingly drinks 3 beers a day  Liver function has improved   Elevated LFTs on admission - likely due to alcohol abuse - normalized   Anemia- likely acute blood loss - Hb 16.2 in 01/08/14 - now 10-12 range - anemia panel reviewed - sufficient Iron, folic acid and B12 stores- Retic count appropriately elevated        DVT prophylaxis: SCDS,lovenox    Antimicrobials:  Anti-infectives    Start     Dose/Rate Route Frequency Ordered Stop   02/23/17 1445  ceFAZolin (ANCEF) IVPB 2g/100 mL premix     2 g 200 mL/hr over 30 Minutes Intravenous  Once 02/23/17 1431 02/23/17 1654       Objective: Vitals:   03/04/17 1825 03/04/17 2053 03/05/17 0041 03/05/17 0500  BP: 101/78 128/84 119/79 123/84  Pulse: 86 87 91 72  Resp: 20 20 20 18   Temp: 98.5 F (36.9 C) 98.1 F (36.7 C) 98.5 F (36.9 C) 97.6 F (36.4 C)  TempSrc: Oral Oral Oral Oral  SpO2: 98% 100% 100% 100%  Weight:      Height:        Intake/Output Summary (Last 24 hours) at 03/05/17 0921 Last data filed at 03/05/17 0917  Gross per 24 hour  Intake              570 ml  Output                0 ml  Net  570 ml   Filed Weights   02/23/17 1800 02/24/17 0400 03/04/17 1438  Weight: 70.3 kg (155 lb) 67.6 kg (149 lb 0.5 oz) 64.2 kg (141 lb 9.6 oz)    Examination: General exam:  comfortable  HEENT: PERRLA, oral mucosa moist, no sclera icterus or thrush Respiratory system: Clear to auscultation. Respiratory effort normal. Cardiovascular system: S1 & S2 heard, RRR.  No murmurs  Gastrointestinal system: Abdomen soft, non-tender, nondistended. Normal bowel sound. No organomegaly Central nervous system: Alert and oriented. No  focal neurological deficits. Extremities: No cyanosis, clubbing or edema Skin: No rashes or ulcers Psychiatry:  Mood & affect appropriate.     Data Reviewed: I have personally reviewed following labs and imaging studies  CBC:  Recent Labs Lab 02/27/17 0218 02/28/17 0245 03/01/17 0310 03/02/17 0345  WBC 5.2 7.2 6.8 6.5  HGB 11.8* 12.0* 11.1* 11.6*  HCT 33.2* 34.0* 32.1* 33.8*  MCV 86.2 86.3 86.8 87.1  PLT 297 322 308 300   Basic Metabolic Panel:  Recent Labs Lab 03/01/17 0310 03/02/17 0345 03/03/17 0314 03/04/17 0600 03/05/17 0519  NA 124* 126* 125* 129* 128*  K 3.8 3.6 3.8 4.5 4.5  CL 93* 94* 93* 94* 96*  CO2 25 25 28 30 27   GLUCOSE 98 83 103* 93 95  BUN 8 8 11 13 11   CREATININE 0.46* 0.51* 0.53* 0.60* 0.55*  CALCIUM 9.1 9.1 9.1 9.2 9.1   GFR: Estimated Creatinine Clearance: 95.9 mL/min (A) (by C-G formula based on SCr of 0.55 mg/dL (L)). Liver Function Tests:  Recent Labs Lab 02/28/17 1211 03/05/17 0519  AST 28 23  ALT 40 28  ALKPHOS 61 72  BILITOT 0.8 0.6  PROT 6.2* 5.6*  ALBUMIN 3.4* 3.1*   No results for input(s): LIPASE, AMYLASE in the last 168 hours.  Recent Labs Lab 03/01/17 0310  AMMONIA 32   Coagulation Profile: No results for input(s): INR, PROTIME in the last 168 hours. Cardiac Enzymes: No results for input(s): CKTOTAL, CKMB, CKMBINDEX, TROPONINI in the last 168 hours. BNP (last 3 results) No results for input(s): PROBNP in the last 8760 hours. HbA1C: No results for input(s): HGBA1C in the last 72 hours. CBG: No results for input(s): GLUCAP in the last 168 hours. Lipid Profile: No results for input(s): CHOL, HDL, LDLCALC, TRIG, CHOLHDL, LDLDIRECT in the last 72 hours. Thyroid Function Tests: No results for input(s): TSH, T4TOTAL, FREET4, T3FREE, THYROIDAB in the last 72 hours. Anemia Panel: No results for input(s): VITAMINB12, FOLATE, FERRITIN, TIBC, IRON, RETICCTPCT in the last 72 hours. Urine analysis:    Component Value  Date/Time   COLORURINE YELLOW 02/28/2017 1237   APPEARANCEUR HAZY (A) 02/28/2017 1237   LABSPEC 1.010 02/28/2017 1237   PHURINE 8.5 (H) 02/28/2017 1237   GLUCOSEU NEGATIVE 02/28/2017 1237   HGBUR MODERATE (A) 02/28/2017 1237   BILIRUBINUR NEGATIVE 02/28/2017 1237   KETONESUR NEGATIVE 02/28/2017 1237   PROTEINUR NEGATIVE 02/28/2017 1237   UROBILINOGEN 0.2 03/15/2007 1720   NITRITE NEGATIVE 02/28/2017 1237   LEUKOCYTESUR NEGATIVE 02/28/2017 1237   Sepsis Labs: @LABRCNTIP (procalcitonin:4,lacticidven:4) ) Recent Results (from the past 240 hour(s))  MRSA PCR Screening     Status: None   Collection Time: 02/23/17  5:54 PM  Result Value Ref Range Status   MRSA by PCR NEGATIVE NEGATIVE Final    Comment:        The GeneXpert MRSA Assay (FDA approved for NASAL specimens only), is one component of a comprehensive MRSA colonization surveillance program. It is not intended  to diagnose MRSA infection nor to guide or monitor treatment for MRSA infections.          Radiology Studies: No results found.    Scheduled Meds: . aspirin EC  81 mg Oral Daily  . atorvastatin  20 mg Oral q1800  . chlorhexidine  15 mL Mouth Rinse BID  . enoxaparin (LOVENOX) injection  40 mg Subcutaneous Q24H  . feeding supplement (ENSURE ENLIVE)  237 mL Oral TID BM  . folic acid  1 mg Oral Daily  . levETIRAcetam  500 mg Oral Q12H  . lisinopril  20 mg Oral Daily  . metoprolol succinate  50 mg Oral Daily  . multivitamin with minerals  1 tablet Oral Daily  . neomycin-bacitracin-polymyxin   Topical BID  . nicotine  21 mg Transdermal Daily  . pantoprazole  40 mg Oral Daily  . polyethylene glycol  17 g Oral Daily  . senna-docusate  1 tablet Oral BID  . thiamine  100 mg Oral Daily   Continuous Infusions:    LOS: 10 days    Time spent in minutes: 20     OSEI-BONSU,Royelle Hinchman, MD [facility] Pager: [phone] [email or web address] Password [email or web address] 03/05/2017, 9:21 AM

## 2017-03-05 NOTE — Progress Notes (Signed)
  Progress Note: General Surgery Service   Assessment/Plan: Patient Active Problem List   Diagnosis Date Noted  . Alcohol abuse   . Acute hyponatremia 02/28/2017  . Tobacco abuse 02/28/2017  . Intracranial bleed (HCC)   . SDH (subdural hematoma) (HCC)   . TBI (traumatic brain injury) (HCC)   . ETOH abuse   . Tachycardia   . Uncontrolled hypertension   . Tachypnea   . Hyperglycemia   . Hypokalemia   . Hyponatremia   . Anemia    Continued hyponatremia - continue fluid restriction Continue reorientation, PT/OT Await CIR decision    LOS: 10 days  Chief Complaint/Subjective: Hallucinations overnight, today calm, tolerating diet, ambulating with assist better  Objective: Vital signs in last 24 hours: Temp:  [97.6 F (36.4 C)-98.5 F (36.9 C)] 97.6 F (36.4 C) (07/07 1049) Pulse Rate:  [22-91] 83 (07/07 1049) Resp:  [18-20] 20 (07/07 1049) BP: (101-128)/(77-84) 118/77 (07/07 1049) SpO2:  [98 %-100 %] 100 % (07/07 1049) Weight:  [64.2 kg (141 lb 9.6 oz)] 64.2 kg (141 lb 9.6 oz) (07/06 1438) Last BM Date: 03/04/17  Intake/Output from previous day: 07/06 0701 - 07/07 0700 In: 450 [P.O.:450] Out: -  Intake/Output this shift: Total I/O In: 120 [P.O.:120] Out: -   Lungs: CTAB  Cardiovascular: RRR  Abd: soft, NT, ND  Extremities: no edema 5/5 x4 strength  Neuro: GCS 15 AOx2 (wrong year)  Lab Results: CBC  No results for input(s): WBC, HGB, HCT, PLT in the last 72 hours. BMET  Recent Labs  03/04/17 0600 03/05/17 0519  NA 129* 128*  K 4.5 4.5  CL 94* 96*  CO2 30 27  GLUCOSE 93 95  BUN 13 11  CREATININE 0.60* 0.55*  CALCIUM 9.2 9.1   PT/INR No results for input(s): LABPROT, INR in the last 72 hours. ABG No results for input(s): PHART, HCO3 in the last 72 hours.  Invalid input(s): PCO2, PO2  Studies/Results:  Anti-infectives: Anti-infectives    Start     Dose/Rate Route Frequency Ordered Stop   02/23/17 1445  ceFAZolin (ANCEF) IVPB 2g/100 mL  premix     2 g 200 mL/hr over 30 Minutes Intravenous  Once 02/23/17 1431 02/23/17 1654      Medications: Scheduled Meds: . aspirin EC  81 mg Oral Daily  . atorvastatin  20 mg Oral q1800  . chlorhexidine  15 mL Mouth Rinse BID  . enoxaparin (LOVENOX) injection  40 mg Subcutaneous Q24H  . feeding supplement (ENSURE ENLIVE)  237 mL Oral TID BM  . folic acid  1 mg Oral Daily  . levETIRAcetam  500 mg Oral Q12H  . lisinopril  20 mg Oral Daily  . metoprolol succinate  50 mg Oral Daily  . multivitamin with minerals  1 tablet Oral Daily  . neomycin-bacitracin-polymyxin   Topical BID  . nicotine  21 mg Transdermal Daily  . pantoprazole  40 mg Oral Daily  . polyethylene glycol  17 g Oral Daily  . senna-docusate  1 tablet Oral BID  . thiamine  100 mg Oral Daily   Continuous Infusions: PRN Meds:.acetaminophen **OR** acetaminophen (TYLENOL) oral liquid 160 mg/5 mL **OR** acetaminophen, hydrALAZINE, LORazepam, LORazepam, oxyCODONE  Rodman PickleLuke Aaron Katherine Syme, MD Pg# [phone] [facility]

## 2017-03-06 LAB — BASIC METABOLIC PANEL
Anion gap: 9 (ref 5–15)
BUN: 13 mg/dL (ref 6–20)
CALCIUM: 9.2 mg/dL (ref 8.9–10.3)
CO2: 26 mmol/L (ref 22–32)
CREATININE: 0.57 mg/dL — AB (ref 0.61–1.24)
Chloride: 94 mmol/L — ABNORMAL LOW (ref 101–111)
GFR calc Af Amer: >60 mL/min (ref >60)
GFR calc non Af Amer: >60 mL/min (ref >60)
GLUCOSE: 101 mg/dL — AB (ref 65–99)
Potassium: 4.2 mmol/L (ref 3.5–5.1)
Sodium: 129 mmol/L — ABNORMAL LOW (ref 135–145)

## 2017-03-06 NOTE — Progress Notes (Signed)
Clinical Social Worker met with a patient and family (mom and sister) at bedside to discuss disposition. Family stated that patient does not have a place to go home too and that "patient is basically homeless". Patients mother stated that patient is not able to live with her in [place]. Family would like patient to discharge to SNF for rehab and once finished with rehab they would like patient to enter an inpatient substance abuse facility.  CSW gave family/patient a list of facility in [place] and the surrounding area for inpatient/outpatient rehab for substance abuse. Patient is agreeable to go to SNF and is aware that it will not be in [place] area. CSW made patient aware that regular SW will be back Monday and will continue to follow for needs.  Rhea Pink, MSW,  [place]
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
## 2017-03-07 LAB — BASIC METABOLIC PANEL
Anion gap: 6 (ref 5–15)
BUN: 11 mg/dL (ref 6–20)
CALCIUM: 9.1 mg/dL (ref 8.9–10.3)
CO2: 26 mmol/L (ref 22–32)
CREATININE: 0.5 mg/dL — AB (ref 0.61–1.24)
Chloride: 98 mmol/L — ABNORMAL LOW (ref 101–111)
GFR calc Af Amer: >60 mL/min (ref >60)
GFR calc non Af Amer: >60 mL/min (ref >60)
GLUCOSE: 102 mg/dL — AB (ref 65–99)
Potassium: 4.2 mmol/L (ref 3.5–5.1)
Sodium: 130 mmol/L — ABNORMAL LOW (ref 135–145)
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
## 2017-03-07 NOTE — Progress Notes (Signed)
CSW met with patient's mother and patient's sister at bedside to discuss discharge planning options. CSW confirmed that patient was not accepted anywhere local for an LOG placement, and that the next step in the process will be to consult with the medical director to obtain a placement within the wider network of [facility]. Patient's mother and patient's sister appreciated the update, and requested to be updated with information as the process continues.  Patient's mother and patient's sister also asked about the Medicaid application and if that could be completed over the phone or email, or if it had to be completed in person. CSW obtained information from the financial counselor who had met with the family last week, and indicated they should call. CSW also Advice worker to request that someone update the family on the Medicaid application before they leave the hospital at noon today.  CSW will follow to facilitate SNF discharge.  Laveda Abbe, [place] Clinical Social Worker [phone]
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
## 2017-03-07 NOTE — Progress Notes (Signed)
Reviewed patient chart  All medical issues addressed  Sodium 130, continue 1200 mL per day fluid restriction  provide outpatient referral to follow up with Dr. Olga Millersrenshaw, Brian   , at the time of discharge  No charge, will sign off

## 2017-03-08 LAB — BASIC METABOLIC PANEL
Anion gap: 6 (ref 5–15)
BUN: 11 mg/dL (ref 6–20)
CALCIUM: 9.2 mg/dL (ref 8.9–10.3)
CHLORIDE: 97 mmol/L — AB (ref 101–111)
CO2: 28 mmol/L (ref 22–32)
CREATININE: 0.57 mg/dL — AB (ref 0.61–1.24)
GFR calc Af Amer: >60 mL/min (ref >60)
GFR calc non Af Amer: >60 mL/min (ref >60)
GLUCOSE: 149 mg/dL — AB (ref 65–99)
Potassium: 4.3 mmol/L (ref 3.5–5.1)
Sodium: 131 mmol/L — ABNORMAL LOW (ref 135–145)

## 2017-03-08 MED ORDER — DIPHENHYDRAMINE HCL 25 MG PO CAPS
50.0000 mg | ORAL_CAPSULE | Freq: Every evening | ORAL | Status: DC | PRN
  Administered 2017-03-09: 50 mg via ORAL
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
## 2017-03-08 NOTE — Progress Notes (Signed)
Order Discontinuation of sitter at 1100 today 03/08/17.

## 2017-03-08 NOTE — Progress Notes (Addendum)
CSW following to facilitate discharge to SNF. CSW got approval from Wellsite geologistMedical Director for [facility]. CS[facility]ed out [facility] [place], and [place]. CSW will follow up to determine bed offers.  CSW will update when bed offers are confirmed and placement is located.  Blenda NicelyElizabeth Kanon Novosel, LCSW Clinical Social Worker [phone]  UPDATE: CSW contacted patient's mother to inform her of updated information regarding placement. Patient's mother requested information on assisted living facilities, just in case it's needed after he is discharged from SNF in 30 days. CSW will follow up to provide resources, and will keep patient's mother informed of discharge plan.  Blenda NicelyElizabeth Jesse Hirst, [place] Clinical Social Worker [phone]
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
## 2017-03-08 NOTE — Progress Notes (Signed)
Patient alert, verbal.  Uses call light to make needs known, waits for assistance for ambulation to toilet.

## 2017-03-09 DIAGNOSIS — W19XXXA Unspecified fall, initial encounter: Secondary | ICD-10-CM | POA: Diagnosis present

## 2017-03-09 MED ORDER — THIAMINE HCL 100 MG PO TABS: 100.0000 mg | ORAL_TABLET | Freq: Every day | ORAL | Status: AC

## 2017-03-09 MED ORDER — SENNOSIDES-DOCUSATE SODIUM 8.6-50 MG PO TABS: 1.0000 | ORAL_TABLET | Freq: Two times a day (BID) | ORAL | Status: AC

## 2017-03-09 MED ORDER — LISINOPRIL 20 MG PO TABS: 20.0000 mg | ORAL_TABLET | Freq: Every day | ORAL | Status: AC

## 2017-03-09 MED ORDER — ENSURE ENLIVE PO LIQD: 237.0000 mL | Freq: Three times a day (TID) | ORAL | 12 refills | Status: AC

## 2017-03-09 MED ORDER — LEVETIRACETAM 500 MG PO TABS: 500.0000 mg | ORAL_TABLET | Freq: Two times a day (BID) | ORAL | Status: AC

## 2017-03-09 MED ORDER — LORAZEPAM 1 MG PO TABS: 1.0000 mg | ORAL_TABLET | Freq: Four times a day (QID) | ORAL | 0 refills | Status: AC | PRN

## 2017-03-09 MED ORDER — ASPIRIN 81 MG PO TBEC: 81.0000 mg | DELAYED_RELEASE_TABLET | Freq: Every day | ORAL | Status: AC

## 2017-03-09 MED ORDER — ATORVASTATIN CALCIUM 20 MG PO TABS: 20.0000 mg | ORAL_TABLET | Freq: Every day | ORAL | Status: AC

## 2017-03-09 MED ORDER — METOPROLOL SUCCINATE ER 50 MG PO TB24: 50.0000 mg | ORAL_TABLET | Freq: Every day | ORAL | Status: AC

## 2017-03-09 MED ORDER — POLYETHYLENE GLYCOL 3350 17 G PO PACK: 17.0000 g | PACK | Freq: Every day | ORAL | 0 refills | Status: AC | PRN

## 2017-03-09 MED ORDER — NICOTINE 21 MG/24HR TD PT24: 21.0000 mg | MEDICATED_PATCH | Freq: Every day | TRANSDERMAL | 0 refills | Status: AC

## 2017-03-09 MED ORDER — BACITRACIN-NEOMYCIN-POLYMYXIN OINTMENT TUBE: 1.0000 "application " | TOPICAL_OINTMENT | Freq: Two times a day (BID) | CUTANEOUS | Status: AC

## 2017-03-09 MED ORDER — FOLIC ACID 1 MG PO TABS: 1.0000 mg | ORAL_TABLET | Freq: Every day | ORAL | Status: AC

## 2017-03-09 MED ORDER — OXYCODONE HCL 5 MG PO TABS: 5.0000 mg | ORAL_TABLET | ORAL | 0 refills | Status: AC | PRN

## 2017-03-09 MED ORDER — ADULT MULTIVITAMIN W/MINERALS CH: 1.0000 | ORAL_TABLET | Freq: Every day | ORAL | Status: AC

## 2017-03-09 MED ORDER — ACETAMINOPHEN 325 MG PO TABS: 650.0000 mg | ORAL_TABLET | ORAL | Status: AC | PRN
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
## 2017-03-09 NOTE — Progress Notes (Signed)
CSW following to facilitate discharge. CSW contacted [facility] [employer] to set up transport to SNF; cannot arrange transport until tomorrow morning. Patient will be picked up at 11:30 am tomorrow morning to transport to SNF.  CSW will follow up tomorrow to ensure transportation picks up patient to go to SNF.  Blenda NicelyElizabeth Kalilah Barua, [place]CS[place] Clinical Social Worker [phone]
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
## 2017-03-09 NOTE — Progress Notes (Signed)
Trauma Service Note  Subjective: Patient doing well.  Going to the restroom currently  Objective: Vital signs in last 24 hours: Temp:  [97.9 F (36.6 C)-98.8 F (37.1 C)] 98.7 F (37.1 C) (07/11 0509) Pulse Rate:  [72-93] 72 (07/11 0509) Resp:  [17-20] 18 (07/11 0509) BP: (106-125)/(70-84) 125/78 (07/11 0509) SpO2:  [98 %-100 %] 100 % (07/11 0509) Last BM Date: 03/08/17  Intake/Output from previous day: 07/10 0701 - 07/11 0700 In: 120 [P.O.:120] Out: -  Intake/Output this shift: No intake/output data recorded.  General: No distress  Lungs: No changes  Abd: soft, good bowel sounds.  Extremities: Intact  Neuro: No changes.    Lab Results: CBC  No results for input(s): WBC, HGB, HCT, PLT in the last 72 hours. BMET  Recent Labs  03/07/17 0329 03/08/17 0329  NA 130* 131*  K 4.2 4.3  CL 98* 97*  CO2 26 28  GLUCOSE 102* 149*  BUN 11 11  CREATININE 0.50* 0.57*  CALCIUM 9.1 9.2   PT/INR No results for input(s): LABPROT, INR in the last 72 hours. ABG No results for input(s): PHART, HCO3 in the last 72 hours.  Invalid input(s): PCO2, PO2  Studies/Results: No results found.  Anti-infectives: Anti-infectives    Start     Dose/Rate Route Frequency Ordered Stop   02/23/17 1445  ceFAZolin (ANCEF) IVPB 2g/100 mL premix     2 g 200 mL/hr over 30 Minutes Intravenous  Once 02/23/17 1431 02/23/17 1654      Assessment/Plan: s/p  Discharge when bed available.  LOS: 14 days   Marta LamasJames O. Gae BonWyatt, III, MD, FACS [phone] Trauma Surgeon 03/09/2017
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
## 2017-03-10 NOTE — Progress Notes (Signed)
Discharge to: [facility] Anticipated discharge date: 03/10/17 Family notified: Yes, by phone [facility] by: [employer] [facility] [employer]  Report #: [phone]  CSW signing off.  Blenda Nicelylizabeth Halina Asano LCSW [phone]

## 2017-03-10 NOTE — Progress Notes (Signed)
Pt discharging at this time to [facility] taking all personal belongings. Discharge instructions along with prescriptions provided. Pt denies pain or discomfort. Attempted x2 to call report.
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
